# Patient Record
Sex: Female | Born: 1940 | Race: White | Hispanic: No | Marital: Married | Smoking: Never smoker
Health system: Southern US, Community
[De-identification: ages and names within clinical notes are randomized; demographics above are authoritative.]

## PROBLEM LIST (undated history)

## (undated) DIAGNOSIS — I10 Essential (primary) hypertension: Secondary | ICD-10-CM

## (undated) DIAGNOSIS — K219 Gastro-esophageal reflux disease without esophagitis: Secondary | ICD-10-CM

## (undated) DIAGNOSIS — K469 Unspecified abdominal hernia without obstruction or gangrene: Secondary | ICD-10-CM

## (undated) DIAGNOSIS — H3530 Unspecified macular degeneration: Secondary | ICD-10-CM

## (undated) DIAGNOSIS — D649 Anemia, unspecified: Secondary | ICD-10-CM

## (undated) HISTORY — DX: Unspecified macular degeneration: H35.30

## (undated) HISTORY — DX: Gastro-esophageal reflux disease without esophagitis: K21.9

## (undated) HISTORY — PX: UPPER GASTROINTESTINAL ENDOSCOPY: SHX188

## (undated) HISTORY — DX: Unspecified abdominal hernia without obstruction or gangrene: K46.9

## (undated) HISTORY — DX: Essential (primary) hypertension: I10

## (undated) HISTORY — PX: CHOLECYSTECTOMY: SHX55

## (undated) HISTORY — PX: COLONOSCOPY: SHX174

## (undated) HISTORY — PX: ABDOMINAL HYSTERECTOMY: SHX81

## (undated) HISTORY — PX: ORIF ANKLE FRACTURE: SUR919

---

## 2011-12-16 ENCOUNTER — Encounter (INDEPENDENT_AMBULATORY_CARE_PROVIDER_SITE_OTHER): Payer: Self-pay | Admitting: Internal Medicine

## 2011-12-16 ENCOUNTER — Ambulatory Visit (INDEPENDENT_AMBULATORY_CARE_PROVIDER_SITE_OTHER): Payer: Medicare Other | Admitting: Internal Medicine

## 2011-12-16 VITALS — BP 140/90 | HR 80 | Temp 98.1°F | Resp 16 | Ht 63.0 in | Wt 197.1 lb

## 2011-12-16 DIAGNOSIS — K219 Gastro-esophageal reflux disease without esophagitis: Secondary | ICD-10-CM

## 2011-12-16 DIAGNOSIS — D509 Iron deficiency anemia, unspecified: Secondary | ICD-10-CM | POA: Insufficient documentation

## 2011-12-16 DIAGNOSIS — I10 Essential (primary) hypertension: Secondary | ICD-10-CM | POA: Insufficient documentation

## 2011-12-16 MED ORDER — FERROUS SULFATE 325 (65 FE) MG PO TABS: 325.0000 mg | ORAL_TABLET | Freq: Two times a day (BID) | ORAL | Status: DC

## 2011-12-16 NOTE — Patient Instructions (Signed)
Ferrous sulfate 325 mg by mouth after breakfast and evening meal. Hemoccult x1 if stool turns black again. Esophagogastroduodenoscopy to be scheduled.

## 2011-12-16 NOTE — H&P (Signed)
Dictated; Job [phone]

## 2011-12-17 ENCOUNTER — Other Ambulatory Visit (INDEPENDENT_AMBULATORY_CARE_PROVIDER_SITE_OTHER): Payer: Self-pay | Admitting: *Deleted

## 2011-12-17 DIAGNOSIS — K921 Melena: Secondary | ICD-10-CM

## 2011-12-17 DIAGNOSIS — D509 Iron deficiency anemia, unspecified: Secondary | ICD-10-CM

## 2011-12-17 NOTE — H&P (Signed)
Sara, [name]               ACCOUNT NO.:  [id number]  MEDICAL RECORD NO.:  [id number]  LOCATION:                                 FACILITY:  PHYSICIAN:  Lionel December, M.D.    DATE OF BIRTH:  [birth year]  DATE OF ADMISSION:  12/21/2011 DATE OF DISCHARGE:  LH                             HISTORY & PHYSICAL   PRESENTING COMPLAINT:  Melena and anemia.  Studies suggest iron deficiency anemia.  HISTORY OF PRESENT ILLNESS:  Sara [name] is 71 year old Caucasian female, who is well known to me from previous evaluation for chronic GERD, GI bleed, and iron deficiency anemia, who was in usual state of health until about 4 weeks ago when she started experiencing intermittent melena or tarry stools.  She did not experience any nausea, vomiting, abdominal pain.  However, she noted she was getting tired quickly and had no energy, also noted dyspnea on walking.  She was seen at [facility] by Ms. [facility], 2 days ago.  She had lab studies prior to that visit and was noted to be anemic.  She also had iron studies confirming iron deficiency.  These are reviewed under lab data.  Her stool however was guaiac negative.  The patient states that last time she had tarry stool was 10 days ago.  Now she is having formed stool daily.  She states her heartburn is well controlled.  Only time she has problem is if she eats late or if she eats chocolate or rich foods.  She denies dysphagia, but at times she feels food is sitting at her chest for a while.  Lately, she has also noted gurgling in her chest.  She has a good appetite.  She has lost few pounds voluntarily over the last 1 year.  She states since she has been taking laxative, she has not experienced symptoms of diverticulitis.  She has been using Advil 3 tablets at a time once or twice daily on as needed basis for the last few weeks because of left knee pain.  She stopped using Advil and she saw Ms. Skillman, and she was  given oxycodone, she could use for her knee pain.  She does not have craving for ice or any other foods.  CURRENT MEDICATIONS: 1. Diltiazem 120 mg p.o. daily. 2. Colon herbal cleanser 2 tablets p.o. at bedtime. 3. MVI with minerals one p.o. daily. 4. Omega-3 one g p.o. b.i.d. 5. Omeprazole 20 mg p.o. b.i.d. 6. Oxycodone 5 mg q.6 p.r.n. 7. Probiotic 1 p.o. daily p.r.n. 8. Ranitidine 150 mg p.o. at bedtime.  PAST MEDICAL HISTORY:  Medical problems include hypertension, chronic GERD.  She was found to have large hiatal hernia by the upper GI series 30 years ago.  Few years ago, she was evaluated for iron deficiency anemia and had EGD colonoscopy as well as given capsule study.  More recently, she had another EGD and she was bleeding from possibly an ulcer at level of hiatus and treated endoscopically.  History of multiple episodes of diverticulitis, but she has not had one in 4 or 5 years.  Cholecystectomy in 1975.  She had BSO with hysterectomy for fibroids and excessive  bleeding in 1990.  She has had benign lesion removed from her left breast.  She had surgery for right ankle fracture in 1997.  ALLERGIES:  NK.  FAMILY HISTORY:  Father died of triple rupture at age 68.  Mother had CAD and died of mesenteric thrombosis at age 35.  She had 3 brothers and they are all deceased one died of tumor on his spine at age 59 and other one died after getting shot accidently at age 73, and third brother died of pancreatic carcinoma at age 55.  SOCIAL HISTORY:  She is married.  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  She has never smoked cigarettes, and drinks alcohol very occasionally.  PHYSICAL EXAMINATION:  VITAL SIGNS:  She weighs 197 pounds, she is 63 inches tall, pulse 80 per minute and regular, blood pressure 140/90, respirations 16, and temp is 98.1. HEENT:  Conjunctivae is somewhat pale.   Sclera is nonicteric. Oropharyngeal mucosa is normal.  No neck masses or thyromegaly noted. CARDIAC:  With regular rhythm.  Normal S1 and S2.  No murmur or gallop noted. LUNGS:  Clear to auscultation. ABDOMEN:  Full.  Bowel sounds are normal.  On palpation, soft, and nontender with no organomegaly or masses. RECTAL:  Deferred. EXTREMITIES:  No peripheral edema, clubbing, or koilonychia noted.  LABORATORY DATA:  From August 12, 2011, WBC 7.0, H and H 11.6 and 35.4, MCV was 85, platelet count 302,000.  Lab data from December 09, 2011, WBC 6.8, H and H is 9.4 and 32.4, MCV 75, platelet count 314,000.  Comprehensive chemistry panel, electrolytes normal, glucose 107, BUN 15, creatinine 0.71, bilirubin 0.2, AP 92, AST 14, ALT 14, total protein 6.8, with albumin of 4.1, calcium 9.2.  TSH 1.910, serum iron 15, TIBC 515, saturation 3%, serum ferritin 4, B12 757, folate greater than 19.9.  ASSESSMENT:  Sara [name] is a 71 year old Caucasian female who has been experiencing intermittent melena over the last few weeks.  Last episode was 10 days ago.  She also has experienced exertional dyspnea and fatigue and found to have microcytic anemia, studies confirming that she has iron deficiency.  She has history of such in the past.  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  She gives history of intermittent gurgling in her chest and I wonder if her hernia has gotten large and has a dependent segment with mucosal injury.  RECOMMENDATIONS: 1. Hemoccult x1, if she has another episode of tarry stool. 2. She will continue omeprazole may be at current dose. 3. Ferrous sulfate 325 mg p.o. b.i.d. 4. Diagnostic esophagogastroduodenoscopy to be scheduled in near     future.                                            ______________________________ Lionel December, M.D.     NR/MEDQ  D:  12/16/2011  T:  12/16/2011  Job:  [id number]  cc:   [facility] Royann Shivers, PA

## 2011-12-20 ENCOUNTER — Encounter (HOSPITAL_COMMUNITY): Payer: Self-pay | Admitting: Pharmacy Technician

## 2011-12-20 MED ORDER — SODIUM CHLORIDE 0.45 % IV SOLN
Freq: Once | INTRAVENOUS | Status: AC
  Administered 2011-12-21: 07:00:00 via INTRAVENOUS

## 2011-12-21 ENCOUNTER — Encounter (HOSPITAL_COMMUNITY): Payer: Self-pay

## 2011-12-21 ENCOUNTER — Telehealth (INDEPENDENT_AMBULATORY_CARE_PROVIDER_SITE_OTHER): Payer: Self-pay | Admitting: *Deleted

## 2011-12-21 ENCOUNTER — Ambulatory Visit (HOSPITAL_COMMUNITY)
Admission: RE | Admit: 2011-12-21 | Discharge: 2011-12-21 | Disposition: A | Discharge status: Home Health | Payer: Medicare Other | Source: Ambulatory Visit | Attending: Internal Medicine | Admitting: Internal Medicine

## 2011-12-21 ENCOUNTER — Encounter (HOSPITAL_COMMUNITY)
Admission: RE | Disposition: A | Discharge status: Home Health | Payer: Self-pay | Source: Ambulatory Visit | Attending: Internal Medicine

## 2011-12-21 DIAGNOSIS — I10 Essential (primary) hypertension: Secondary | ICD-10-CM | POA: Insufficient documentation

## 2011-12-21 DIAGNOSIS — K449 Diaphragmatic hernia without obstruction or gangrene: Secondary | ICD-10-CM

## 2011-12-21 DIAGNOSIS — D509 Iron deficiency anemia, unspecified: Secondary | ICD-10-CM

## 2011-12-21 DIAGNOSIS — Z8719 Personal history of other diseases of the digestive system: Secondary | ICD-10-CM

## 2011-12-21 DIAGNOSIS — K921 Melena: Secondary | ICD-10-CM | POA: Insufficient documentation

## 2011-12-21 DIAGNOSIS — Z79899 Other long term (current) drug therapy: Secondary | ICD-10-CM | POA: Insufficient documentation

## 2011-12-21 HISTORY — DX: Anemia, unspecified: D64.9

## 2011-12-21 HISTORY — PX: ESOPHAGOGASTRODUODENOSCOPY: SHX5428

## 2011-12-21 SURGERY — EGD (ESOPHAGOGASTRODUODENOSCOPY)
Anesthesia: Moderate Sedation

## 2011-12-21 MED ORDER — MIDAZOLAM HCL 5 MG/5ML IJ SOLN
INTRAMUSCULAR | Status: AC
  Filled 2011-12-21: qty 10

## 2011-12-21 MED ORDER — STERILE WATER FOR IRRIGATION IR SOLN
Status: DC | PRN
  Administered 2011-12-21: 08:00:00

## 2011-12-21 MED ORDER — MIDAZOLAM HCL 5 MG/5ML IJ SOLN
INTRAMUSCULAR | Status: DC | PRN
  Administered 2011-12-21 (×2): 2 mg via INTRAVENOUS

## 2011-12-21 MED ORDER — MEPERIDINE HCL 50 MG/ML IJ SOLN
INTRAMUSCULAR | Status: AC
  Filled 2011-12-21: qty 1

## 2011-12-21 MED ORDER — MEPERIDINE HCL 25 MG/ML IJ SOLN
INTRAMUSCULAR | Status: DC | PRN
  Administered 2011-12-21: 25 mg via INTRAVENOUS

## 2011-12-21 NOTE — Op Note (Signed)
EGD PROCEDURE REPORT  PATIENT:  Sara [name]  MR#:  [id number] Birthdate:  03/28/1941, 71 y.o., female Endoscopist:  Dr. Malissa Hippo, MD Referred By:  Ms. Roma Kayser, [facility]  Procedure Date: 12/21/2011  Procedure:   EGD  Indications:  Patient is a 71 year old Caucasian female who presents with history of melena and a deficiency anemia. She has been on Advil until recently. She has known large hard hernia has been evaluated with EGD colonoscopy and given capsule study in 2009 10 EGD 2000 and gastric Dieulafoy lesion. She is undergoing diagnostic EGD.            Informed Consent:  The risks, benefits, alternatives & imponderables which include, but are not limited to, bleeding, infection, perforation, drug reaction and potential missed lesion have been reviewed.  The potential for biopsy, lesion removal, esophageal dilation, etc. have also been discussed.  Questions have been answered.  All parties agreeable.  Please see history & physical in medical record for more information.  Medications:  Demerol 25 mg IV Versed 4 mg IV Cetacaine spray topically for oropharyngeal anesthesia  Description of procedure:  The endoscope was introduced through the mouth and advanced to the second portion of the duodenum without difficulty or limitations. The mucosal surfaces were surveyed very carefully during advancement of the scope and upon withdrawal.  Findings:  Esophagus:  Mucosa of the esophagus was normal. Unremarkable GE junction. Large hiatal hernia with a wide open hiatus.  Mucosa of the herniated part of the stomach was carefully examined and no lesions identified. Some bile noted in the dependent part of the hernia. GEJ:  31 cm Hiatus:  40 cm Stomach:  Part of the stomach that was below the diaphragm reveals normal mucosa. Pyloric channel was patent. Angularis was unremarkable. Fundus and cardia examined by retroflexion of scope and were normal. Duodenum:  Normal bulbar and post bulbar  mucosa.  Therapeutic/Diagnostic Maneuvers Performed:  None  Complications:  None  Impression: Large sliding hiatal hernia otherwise normal examination. It is possible that NSAID injury has healed now the she is off Advil.  Recommendations:  Continue anti-reflux therapy. H&H in 2 weeks. If she is documented to have recurrent melena or heme positive stools will consider further workup.   Reyah Streeter U  12/21/2011  7:50 AM  CC: Ms. Roma Kayser, PA, PA & Dr. No ref. provider found

## 2011-12-21 NOTE — H&P (Signed)
This is an update to history and physical from 12/16/2011. Has been no interval change in patient's condition. She is undergoing diagnostic EGD.

## 2011-12-21 NOTE — Telephone Encounter (Signed)
Jeani Hawking from ext. [phone] called and said Dr. Karilyn Cota wanted Sara [name] to have an H & H in 2 weeks.

## 2011-12-21 NOTE — Discharge Instructions (Signed)
Resume usual medications and diet. Hemoglobin and hematocrit to be checked in 2 weeks. Office will call. Notify if you have rectal bleeding or melena. No driving for 24 hours.  Esophagogastroduodenoscopy This is an endoscopic procedure (a procedure that uses a device like a flexible telescope) that allows your caregiver to view the upper stomach and small bowel. This test allows your caregiver to look at the esophagus. The esophagus carries food from your mouth to your stomach. They can also look at your duodenum. This is the first part of the small intestine that attaches to the stomach. This test is used to detect problems in the bowel such as ulcers and inflammation. PREPARATION FOR TEST Nothing to eat after midnight the day before the test. NORMAL FINDINGS Normal esophagus, stomach, and duodenum. Ranges for normal findings may vary among different laboratories and hospitals. You should always check with your doctor after having lab work or other tests done to discuss the meaning of your test results and whether your values are considered within normal limits. MEANING OF TEST  Your caregiver will go over the test results with you and discuss the importance and meaning of your results, as well as treatment options and the need for additional tests if necessary. OBTAINING THE TEST RESULTS It is your responsibility to obtain your test results. Ask the lab or department performing the test when and how you will get your results. Document Released: 01/21/2005 Document Revised: 09/09/2011 Document Reviewed: 08/30/2008 [facility] Patient Information 2012 [place], [place].

## 2011-12-22 ENCOUNTER — Telehealth (INDEPENDENT_AMBULATORY_CARE_PROVIDER_SITE_OTHER): Payer: Self-pay | Admitting: *Deleted

## 2011-12-22 DIAGNOSIS — D649 Anemia, unspecified: Secondary | ICD-10-CM

## 2011-12-22 NOTE — Telephone Encounter (Signed)
Lab noted for 01-04-12

## 2011-12-22 NOTE — Telephone Encounter (Signed)
Patient will need H/H 01-04-12.

## 2011-12-23 NOTE — Progress Notes (Signed)
PRESENTING COMPLAINT: Melena and anemia.  Studies suggest iron deficiency anemia.  HISTORY OF PRESENT ILLNESS: Sara [name] is 71 year old Caucasian female,  who is well known to me from previous evaluation for chronic GERD, GI  bleed, and iron deficiency anemia, who was in usual state of health  until about 4 weeks ago when she started experiencing intermittent  melena or tarry stools. She did not experience any nausea, vomiting,  abdominal pain. However, she noted she was getting tired quickly and  had no energy, also noted dyspnea on walking. She was seen at [facility] by Ms. [facility], 2 days ago. She had lab  studies prior to that visit and was noted to be anemic. She also had  iron studies confirming iron deficiency. These are reviewed under lab  data. Her stool however was guaiac negative. The patient states that  last time she had tarry stool was 10 days ago. Now she is having formed  stool daily. She states her heartburn is well controlled. Only time  she has problem is if she eats late or if she eats chocolate or rich  foods. She denies dysphagia, but at times she feels food is sitting at  her chest for a while. Lately, she has also noted gurgling in her  chest. She has a good appetite. She has lost few pounds voluntarily  over the last 1 year. She states since she has been taking laxative,  she has not experienced symptoms of diverticulitis. She has been using  Advil 3 tablets at a time once or twice daily on as needed basis for the  last few weeks because of left knee pain. She stopped using Advil and  she saw Ms. Skillman, and she was given oxycodone, she could use for her  knee pain. She does not have craving for ice or any other foods.  CURRENT MEDICATIONS:  1. Diltiazem 120 mg p.o. daily.  2. Colon herbal cleanser 2 tablets p.o. at bedtime.  3. MVI with minerals one p.o. daily.  4. Omega-3 one g p.o. b.i.d.  5. Omeprazole 20 mg p.o. b.i.d.  6.  Oxycodone 5 mg q.6 p.r.n.  7. Probiotic 1 p.o. daily p.r.n.  8. Ranitidine 150 mg p.o. at bedtime.  PAST MEDICAL HISTORY: Medical problems include hypertension, chronic  GERD. She was found to have large hiatal hernia by the upper GI series  30 years ago. Few years ago, she was evaluated for iron deficiency  anemia and had EGD colonoscopy as well as given capsule study. More  recently, she had another EGD and she was bleeding from possibly an  ulcer at level of hiatus and treated endoscopically.  History of multiple episodes of diverticulitis, but she has not had one  in 4 or 5 years.  Cholecystectomy in 1975.  She had BSO with hysterectomy for fibroids and excessive bleeding in  1990. She has had benign lesion removed from her left breast. She had  surgery for right ankle fracture in 1997.  ALLERGIES: NK.  FAMILY HISTORY: Father died of triple rupture at age 59. Mother had  CAD and died of mesenteric thrombosis at age 29. She had 3 brothers and  they are all deceased one died of tumor on his spine at age 53 and other  one died after getting shot accidently at age 80, and third brother died  of pancreatic carcinoma at age 44.  SOCIAL HISTORY: She is married. 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 She has never smoked cigarettes, and drinks alcohol  very occasionally.  PHYSICAL EXAMINATION: VITAL SIGNS: She weighs 197 pounds, she is 63  inches tall, pulse 80 per minute and regular, blood pressure 140/90,  respirations 16, and temp is 98.1.  HEENT: Conjunctivae is somewhat pale. Sclera is nonicteric.  Oropharyngeal mucosa is normal. No neck masses or thyromegaly noted.  CARDIAC: With regular rhythm. Normal S1 and S2. No murmur or gallop  noted.  LUNGS: Clear to auscultation.  ABDOMEN: Full. Bowel sounds are normal. On palpation, soft, and  nontender with no organomegaly or masses.  RECTAL:  Deferred.  EXTREMITIES: No peripheral edema, clubbing, or koilonychia noted.  LABORATORY DATA: From August 12, 2011, WBC 7.0, H and H 11.6 and 35.4,  MCV was 85, platelet count 302,000.  Lab data from December 09, 2011, WBC 6.8, H and H is 9.4 and 32.4, MCV 75,  platelet count 314,000. Comprehensive chemistry panel, electrolytes  normal, glucose 107, BUN 15, creatinine 0.71, bilirubin 0.2, AP 92, AST  14, ALT 14, total protein 6.8, with albumin of 4.1, calcium 9.2.  TSH 1.910, serum iron 15, TIBC 515, saturation 3%, serum ferritin 4, B12  757, folate greater than 19.9.  ASSESSMENT: Sara [name] is a 71 year old Caucasian female who has been  experiencing intermittent melena over the last few weeks. Last episode  was 10 days ago. She also has experienced exertional dyspnea and  fatigue and found to have microcytic anemia, studies confirming that she  has iron deficiency. She has history of such in the past. She has  known large hiatal hernia and she had a Dieulafoy lesion coagulated in  her stomach few years ago.  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 She gives history of intermittent  gurgling in her chest and I wonder if her hernia has gotten large and  has a dependent segment with mucosal injury.  RECOMMENDATIONS:  1. Hemoccult x1, if she has another episode of tarry stool.  2. She will continue omeprazole may be at current dose.  3. Ferrous sulfate 325 mg p.o. b.i.d.  4. Diagnostic esophagogastroduodenoscopy to be scheduled in near  future.

## 2011-12-24 ENCOUNTER — Encounter (HOSPITAL_COMMUNITY): Payer: Self-pay | Admitting: Internal Medicine

## 2011-12-24 ENCOUNTER — Telehealth (INDEPENDENT_AMBULATORY_CARE_PROVIDER_SITE_OTHER): Payer: Self-pay | Admitting: *Deleted

## 2011-12-24 ENCOUNTER — Encounter (INDEPENDENT_AMBULATORY_CARE_PROVIDER_SITE_OTHER): Payer: Self-pay | Admitting: *Deleted

## 2011-12-24 NOTE — Telephone Encounter (Signed)
Open in Error, however lab was noted.

## 2011-12-30 ENCOUNTER — Encounter (INDEPENDENT_AMBULATORY_CARE_PROVIDER_SITE_OTHER): Payer: Self-pay

## 2012-01-03 ENCOUNTER — Other Ambulatory Visit (INDEPENDENT_AMBULATORY_CARE_PROVIDER_SITE_OTHER): Payer: Self-pay | Admitting: Internal Medicine

## 2012-01-04 ENCOUNTER — Telehealth (INDEPENDENT_AMBULATORY_CARE_PROVIDER_SITE_OTHER): Payer: Self-pay | Admitting: *Deleted

## 2012-01-04 DIAGNOSIS — D649 Anemia, unspecified: Secondary | ICD-10-CM

## 2012-01-04 LAB — HEMOGLOBIN AND HEMATOCRIT, BLOOD: Hemoglobin: 10.5 g/dL — ABNORMAL LOW (ref 12.0–15.0)

## 2012-01-04 NOTE — Telephone Encounter (Signed)
Lab report to PCP  

## 2012-01-04 NOTE — Telephone Encounter (Signed)
Per Dr. Karilyn Cota the patient will need to have a repeat H/H in 2 months followed by a office appointment with him.

## 2012-01-05 NOTE — Telephone Encounter (Signed)
Apt has been scheduled for 03/21/12 at 10:30 am with Dr. Karilyn Cota

## 2012-03-03 ENCOUNTER — Other Ambulatory Visit (INDEPENDENT_AMBULATORY_CARE_PROVIDER_SITE_OTHER): Payer: Self-pay | Admitting: *Deleted

## 2012-03-03 ENCOUNTER — Encounter (INDEPENDENT_AMBULATORY_CARE_PROVIDER_SITE_OTHER): Payer: Self-pay | Admitting: *Deleted

## 2012-03-03 DIAGNOSIS — D649 Anemia, unspecified: Secondary | ICD-10-CM

## 2012-03-09 LAB — CBC WITH DIFFERENTIAL/PLATELET
Eosinophils Absolute: 0.2 10*3/uL (ref 0.0–0.7)
Hemoglobin: 13.8 g/dL (ref 12.0–15.0)
Lymphocytes Relative: 38 % (ref 12–46)
Lymphs Abs: 3 10*3/uL (ref 0.7–4.0)
MCH: 27.4 pg (ref 26.0–34.0)
Monocytes Relative: 9 % (ref 3–12)
Neutro Abs: 4.1 10*3/uL (ref 1.7–7.7)
Neutrophils Relative %: 50 % (ref 43–77)
Platelets: 242 10*3/uL (ref 150–400)
RBC: 5.04 MIL/uL (ref 3.87–5.11)
WBC: 8.1 10*3/uL (ref 4.0–10.5)

## 2012-03-21 ENCOUNTER — Ambulatory Visit (INDEPENDENT_AMBULATORY_CARE_PROVIDER_SITE_OTHER): Payer: Medicare Other | Admitting: Internal Medicine

## 2012-03-21 ENCOUNTER — Encounter (INDEPENDENT_AMBULATORY_CARE_PROVIDER_SITE_OTHER): Payer: Self-pay | Admitting: Internal Medicine

## 2012-03-21 VITALS — BP 122/74 | HR 72 | Temp 98.6°F | Resp 18 | Ht 63.0 in | Wt 197.2 lb

## 2012-03-21 DIAGNOSIS — D509 Iron deficiency anemia, unspecified: Secondary | ICD-10-CM

## 2012-03-21 DIAGNOSIS — K449 Diaphragmatic hernia without obstruction or gangrene: Secondary | ICD-10-CM

## 2012-03-21 DIAGNOSIS — K219 Gastro-esophageal reflux disease without esophagitis: Secondary | ICD-10-CM

## 2012-03-21 MED ORDER — FERROUS SULFATE 325 (65 FE) MG PO TABS: 325.0000 mg | ORAL_TABLET | Freq: Every day | ORAL | Status: DC

## 2012-03-21 NOTE — Patient Instructions (Signed)
Next hemoglobin and hematocrit 2 months. Physician will contact you with results ofUGIS.

## 2012-03-21 NOTE — Progress Notes (Signed)
Presenting complaint;  Follow for GERD and iron deficiency anemia.  Subjective:  Patient is 71 year old Caucasian female who was evaluated in March 2013 for iron deficiency anemia and heme-positive stool. EGD revealed large hiatal hernia. It was felt that she may have NSAID-induced injury to GI tract causing her iron deficiency anemia. Patient was begun on oral iron and maintained on PPI. She feels much better. She has heartburn no more than once a week which is transient. She denies dysphagia. She is still having gurgling in her chest almost daily but she denies pain or shortness of breath. She also denies nocturnal regurgitation. Her stools are hard dark because she takes iron..  Current Medications: Current Outpatient Prescriptions  Medication Sig Dispense Refill  . citalopram (CELEXA) 20 MG tablet 20 mg as needed.       . diltiazem (DILACOR XR) 120 MG 24 hr capsule Take 120 mg by mouth every evening.       . ferrous sulfate 325 (65 FE) MG tablet Take 1 tablet (325 mg total) by mouth 2 (two) times daily.  100 tablet  0  . HYDROcodone-acetaminophen (NORCO) 5-325 MG per tablet 1 tablet as needed.       . Misc Natural Products (COLON HERBAL CLEANSER PO) Take 2 capsules by mouth every evening. Acai Berry Cleanse- Patient takes 2 every night      . Multiple Vitamin (MULITIVITAMIN WITH MINERALS) TABS Take 1 tablet by mouth daily.      . Multiple Vitamins-Minerals (ICAPS) CAPS Take 1 capsule by mouth daily.       . Omega-3 Fatty Acids (FISH OIL) 1000 MG CAPS Take 2,000 mg by mouth every evening.       Marland Kitchen omeprazole (PRILOSEC) 20 MG capsule Take 40 mg by mouth daily.       . Probiotic Product (PROBIOTIC COLON SUPPORT) CAPS Take 1 tablet by mouth daily.       . ranitidine (ZANTAC) 150 MG capsule Take 150 mg by mouth every evening.          Objective: Blood pressure 122/74, pulse 72, temperature 98.6 F (37 C), temperature source Oral, resp. rate 18, height 5\' 3"  (1.6 m), weight 197 lb 3.2 oz  (89.449 kg). Patient is alert and in no acute distress. Conjunctiva is pink. Sclera is nonicteric Oropharyngeal mucosa is normal. No neck masses or thyromegaly noted. Cardiac exam with regular rhythm normal S1 and S2. No murmur or gallop noted. Lungs are clear to auscultation. Abdomen is full but soft and nontender without organomegaly or masses. No LE edema or clubbing noted.  Labs/studies Results: CBC from 03/08/2012 WBC 8.1 H&H 13.8 and 43.1, MCV 85.5 and platelet count 242K  Assessment:  #1. Iron deficiency anemia. H&H is back to normal. IDA felt to be secondary to NSAID induced GI bleed and impaired iron absorption with chronic acid suppression. #2. Large hiatal hernia. She remains with frequent gurgling in her chest but does not have any symptoms suggest refractory. I am concerned his large hernia may lead to problems down the road. Also needs to make sure she does not have organoaxial rotation.   Plan: Decrease ferrous sulfate to 325 milligrams by mouth daily H&H in 2 months. Upper GI series. Office visit in 6 months

## 2012-03-22 ENCOUNTER — Other Ambulatory Visit (INDEPENDENT_AMBULATORY_CARE_PROVIDER_SITE_OTHER): Payer: Self-pay | Admitting: Internal Medicine

## 2012-04-21 ENCOUNTER — Encounter (INDEPENDENT_AMBULATORY_CARE_PROVIDER_SITE_OTHER): Payer: Self-pay

## 2012-09-19 ENCOUNTER — Encounter (INDEPENDENT_AMBULATORY_CARE_PROVIDER_SITE_OTHER): Payer: Self-pay | Admitting: Internal Medicine

## 2012-09-19 ENCOUNTER — Ambulatory Visit (INDEPENDENT_AMBULATORY_CARE_PROVIDER_SITE_OTHER): Payer: Medicare Other | Admitting: Internal Medicine

## 2012-09-19 VITALS — BP 138/90 | HR 72 | Temp 98.1°F | Resp 18 | Ht 63.0 in | Wt 193.5 lb

## 2012-09-19 DIAGNOSIS — B0229 Other postherpetic nervous system involvement: Secondary | ICD-10-CM | POA: Insufficient documentation

## 2012-09-19 DIAGNOSIS — Z862 Personal history of diseases of the blood and blood-forming organs and certain disorders involving the immune mechanism: Secondary | ICD-10-CM

## 2012-09-19 DIAGNOSIS — K219 Gastro-esophageal reflux disease without esophagitis: Secondary | ICD-10-CM

## 2012-09-19 MED ORDER — FERROUS SULFATE 325 (65 FE) MG PO TABS: 325.0000 mg | ORAL_TABLET | Freq: Every day | ORAL | Status: DC

## 2012-09-19 NOTE — Patient Instructions (Signed)
Surgical consultation to be arranged in January 2014. Take one iron pill daily with food instead of two.

## 2012-09-19 NOTE — Progress Notes (Signed)
Presenting complaint;  Followup for chronic GERD and IDA.  Subjective:  Patient is 71 year old Caucasian female who presents for scheduled visit. She was last seen in June 2013. She has chronic GERD and known large hiatal hernia and history of iron deficiency anemia. She denies melena or rectal bleeding. She states her bowels move regularly as long as she stays on herbal laxative. She also reminds me that she has not experienced diverticulitis since she has been on laxative. She rarely experiences heartburn but she continues to have a gurgling in her chest every day. She is sleeping with her head end of bed elevated and does not eat in the evening otherwise she regurgitates. She is having occasional dysphagia like she had yesterday while she was eating hamburger without chewing thoroughly. 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  Current Medications: Current Outpatient Prescriptions  Medication Sig Dispense Refill  . citalopram (CELEXA) 20 MG tablet 20 mg as needed.       . diltiazem (DILACOR XR) 120 MG 24 hr capsule Take 120 mg by mouth every evening.       . ferrous sulfate 325 (65 FE) MG tablet TAKE (1) TABLET TWICE DAILY.  90 tablet  3  . HYDROcodone-acetaminophen (NORCO) 5-325 MG per tablet 1 tablet as needed.       Marland Kitchen ibuprofen (IBU-200) 200 MG tablet Take 200 mg by mouth as needed. Patient takes as needed,while she has cold      . Misc Natural Products (COLON HERBAL CLEANSER PO) Take 2 capsules by mouth every evening. Acai Berry Cleanse- Patient takes 2 every night      . Multiple Vitamin (MULITIVITAMIN WITH MINERALS) TABS Take 1 tablet by mouth daily.      . Multiple Vitamins-Minerals (ICAPS) CAPS Take 1 capsule by mouth daily.       . Omega-3 Fatty Acids (FISH OIL) 1000 MG CAPS Take 2,000 mg by mouth every evening.       Marland Kitchen omeprazole (PRILOSEC) 20 MG capsule Take 40 mg by mouth daily.       . Probiotic Product (PROBIOTIC COLON  SUPPORT) CAPS Take 1 tablet by mouth daily.       . Pseudoeph-Doxylamine-DM-APAP (NYQUIL) 60-7.03-02-999 MG/30ML LIQD Take by mouth. Patient states that she is taking 1 tablespoon at night while having cold         Objective: Blood pressure 138/90, pulse 72, temperature 98.1 F (36.7 C), temperature source Oral, resp. rate 18, height 5\' 3"  (1.6 m), weight 193 lb 8 oz (87.771 kg). Patient is alert and in no acute distress. Conjunctiva is pink. Sclera is nonicteric Oropharyngeal mucosa is normal. No neck masses or thyromegaly noted.. Abdomen is full but soft and nontender without organomegaly or masses. No LE edema or clubbing noted.  Labs/studies Results: From 09/07/2012. WBC 7.2, H&H 13.9 and 42, MCV 92 and platelet count 243K. Comprehensive chemistry panel within normal limits except glucose of 109.  Assessment:  #1. GERD. She has large sliding hiatal hernia with 50% of her stomach in her chest. Despite aggressive anti-reflex measures she still appears to be regurgitating. While her symptoms are manageable for now I'm afraid her symptoms will get worse with time and she should consider having this hernia fixed while she is in good health. Therefore will arrange for surgical consultation. #2. History of iron deficiency anemia secondary to GI blood loss from NSAID-induced injury and perhaps impaired iron absorption. Her H&H is normal.  She can decrease iron pill to one a day   Plan:  Will request surgical consultation with Dr. Claud Kelp for anti-reflux surgery. Patient will go to [facility] and pick up upper GI series for his review. Decrease ferrous sulfate 325 mg by mouth daily. Office visit in 6 months.

## 2012-11-02 ENCOUNTER — Ambulatory Visit (INDEPENDENT_AMBULATORY_CARE_PROVIDER_SITE_OTHER): Payer: Self-pay | Admitting: General Surgery

## 2012-11-07 ENCOUNTER — Encounter (INDEPENDENT_AMBULATORY_CARE_PROVIDER_SITE_OTHER): Payer: Self-pay

## 2012-11-14 ENCOUNTER — Ambulatory Visit (INDEPENDENT_AMBULATORY_CARE_PROVIDER_SITE_OTHER): Payer: Self-pay | Admitting: General Surgery

## 2012-11-20 ENCOUNTER — Encounter (INDEPENDENT_AMBULATORY_CARE_PROVIDER_SITE_OTHER): Payer: Self-pay | Admitting: General Surgery

## 2012-11-20 ENCOUNTER — Ambulatory Visit (INDEPENDENT_AMBULATORY_CARE_PROVIDER_SITE_OTHER): Payer: Medicare Other | Admitting: General Surgery

## 2012-11-20 VITALS — BP 158/100 | HR 84 | Temp 98.0°F | Resp 20 | Ht 63.0 in | Wt 202.2 lb

## 2012-11-20 DIAGNOSIS — K449 Diaphragmatic hernia without obstruction or gangrene: Secondary | ICD-10-CM

## 2012-11-20 DIAGNOSIS — K219 Gastro-esophageal reflux disease without esophagitis: Secondary | ICD-10-CM

## 2012-11-20 NOTE — Progress Notes (Signed)
Patient ID: Sara [name], female   DOB: 1941-02-15, 72 y.o.   MRN: [id number]  No chief complaint on file.   HPI Sara [name] is a 72 y.o. female.  She is referred by Dr. Karilyn Cota in [place] for evaluation of a large sliding hiatal hernia. Primary care is with Roma Kayser, PA.  She thinks she has been told she had a small hiatal hernia for many years. She's had reflux symptoms for over 10 years. Last year in March she was found to have iron deficiency anemia. Upper endoscopy showed a large sliding hiatal hernia, the esophagus was normal, the GE junction was normal, no lesions were identified in the mucosa of the stomach. GEJ at 31 cm. Pylorus was patent.  She previously had some end-stage induced bleeding and says that was cauterized or clipped previously. That has not recurred. Her most recent hemoglobin on 09/08/2012 is 13.9.  For the past 6 months she's been having somewhat more obstructive symptoms. She says that she will feel the foods sticking in her chest. Denies pain but says it feels a little heavy until it passes on through. No vomiting or regurgitation. Over the past 6 months she's had 3 episodes where she ate a  steak and it hung up in her chest she had sharp pain until it passed on through.   She does not have waterbrash now  but she sits up in bed to sleep at night because she was told to. She used to have waterbrash many years ago but said she has been on proton pump inhibitors she doesn't have any more.  During the interview she stated More than once that she really didn't feel that bad and really did not want to have an operation. HPI  Past Medical History  Diagnosis Date  . Hypertension   . GERD (gastroesophageal reflux disease)   . Hernia of unspecified site of abdominal cavity without mention of obstruction or gangrene   . Macular degeneration   . Anemia     IDA    Past Surgical History  Procedure Laterality Date  . Upper gastrointestinal endoscopy    .  Colonoscopy    . Abdominal hysterectomy    . Cholecystectomy    . Orif ankle fracture      right ankle  . Esophagogastroduodenoscopy  12/21/2011    Procedure: ESOPHAGOGASTRODUODENOSCOPY (EGD);  Surgeon: Malissa Hippo, MD;  Location: [facility];  Service: Endoscopy;  Laterality: N/A;  730    Family History  Problem Relation Age of Onset  . Heart disease Mother   . Aneurysm Father   . Pancreatic cancer Brother   . Healthy Daughter   . Healthy Daughter   . Healthy Daughter   . Healthy Son   . Healthy Son   . Anesthesia problems Neg Hx   . Hypotension Neg Hx   . Malignant hyperthermia Neg Hx   . Pseudochol deficiency Neg Hx     Social History History  Substance Use Topics  . Smoking status: Never Smoker   . Smokeless tobacco: Never Used  . Alcohol Use: Yes     Comment: Very Rare    No Known Allergies  Current Outpatient Prescriptions  Medication Sig Dispense Refill  . diltiazem (DILACOR XR) 120 MG 24 hr capsule Take 120 mg by mouth every evening.       . ferrous sulfate 325 (65 FE) MG tablet Take 1 tablet (325 mg total) by mouth daily with breakfast.      .  HYDROcodone-acetaminophen (NORCO) 5-325 MG per tablet 1 tablet as needed.       Marland Kitchen ibuprofen (IBU-200) 200 MG tablet Take 200 mg by mouth as needed. Patient takes as needed,while she has cold      . Misc Natural Products (COLON HERBAL CLEANSER PO) Take 2 capsules by mouth every evening. Acai Berry Cleanse- Patient takes 2 every night      . Multiple Vitamin (MULITIVITAMIN WITH MINERALS) TABS Take 1 tablet by mouth daily.      . Multiple Vitamins-Minerals (ICAPS) CAPS Take 1 capsule by mouth daily.       . Omega-3 Fatty Acids (FISH OIL) 1000 MG CAPS Take 2,000 mg by mouth every evening.       Marland Kitchen omeprazole (PRILOSEC) 20 MG capsule Take 40 mg by mouth daily.       . Probiotic Product (PROBIOTIC COLON SUPPORT) CAPS Take 1 tablet by mouth daily.       . Pseudoeph-Doxylamine-DM-APAP (NYQUIL) 60-7.03-02-999 MG/30ML LIQD  Take by mouth. Patient states that she is taking 1 tablespoon at night while having cold       No current facility-administered medications for this visit.    Review of Systems Review of Systems  Constitutional: Negative for fever, chills and unexpected weight change.  HENT: Negative for hearing loss, congestion, sore throat, trouble swallowing and voice change.   Eyes: Negative for visual disturbance.  Respiratory: Positive for chest tightness and shortness of breath. Negative for cough and wheezing.   Cardiovascular: Negative for chest pain, palpitations and leg swelling.  Gastrointestinal: Negative for nausea, vomiting, abdominal pain, diarrhea, constipation, blood in stool, abdominal distention and anal bleeding.  Genitourinary: Negative for hematuria, vaginal bleeding and difficulty urinating.  Musculoskeletal: Negative for arthralgias.  Skin: Negative for rash and wound.  Neurological: Negative for seizures, syncope and headaches.  Hematological: Negative for adenopathy. Does not bruise/bleed easily.  Psychiatric/Behavioral: Negative for confusion.    Blood pressure 158/100, pulse 84, temperature 98 F (36.7 C), temperature source Temporal, resp. rate 20, height 5\' 3"  (1.6 m), weight 202 lb 3.2 oz (91.717 kg).  Physical Exam Physical Exam  Constitutional: She is oriented to person, place, and time. She appears well-developed and well-nourished. No distress.  BMI 35  HENT:  Head: Normocephalic and atraumatic.  Nose: Nose normal.  Mouth/Throat: No oropharyngeal exudate.  Eyes: Conjunctivae and EOM are normal. Pupils are equal, round, and reactive to light. Left eye exhibits no discharge. No scleral icterus.  Neck: Neck supple. No JVD present. No tracheal deviation present. No thyromegaly present.  Cardiovascular: Normal rate, regular rhythm, normal heart sounds and intact distal pulses.   No murmur heard. Pulmonary/Chest: Effort normal and breath sounds normal. No respiratory  distress. She has no wheezes. She has no rales. She exhibits no tenderness.  Abdominal: Soft. Bowel sounds are normal. She exhibits no distension and no mass. There is no tenderness. There is no rebound and no guarding.  Transverse scar in the epigastrium from previous cholecystectomy, no hernia. Pfannenstiel incision well healed. No tenderness. No mass.  Musculoskeletal: She exhibits no edema and no tenderness.  Lymphadenopathy:    She has no cervical adenopathy.  Neurological: She is alert and oriented to person, place, and time. She exhibits normal muscle tone. Coordination normal.  Skin: Skin is warm. No rash noted. She is not diaphoretic. No erythema. No pallor.  Psychiatric: She has a normal mood and affect. Her behavior is normal. Judgment and thought content normal.    Data Reviewed I reviewed  the upper endoscopy report and the upper GI report from 2013. I reviewed Dr. Patty Sermons office notes. I reviewed lab work from December 2013.  Assessment    Sliding hiatal hernia, large, 50% of stomach in chest. She has developed intermittent obstructive symptoms over the past year. Concerning concerned that she may be intermittently twisting and obstructing her stomach   GERD. Symptoms mostly controlled otherwise on proton pump inhibitors  Obesity  History open cholecystectomy 1975  History TAH, BSO  Remote history diverticulitis    Plan    I had a long talk with the patient. I advised her of her diagnosis and explained to her with diagrams. I told her that her obstructive symptoms will progress over time until she has an operation. I told her that there is some risk to this including obstruction and ischemia and perforation. I described laparoscopic reduction and repair of hiatal hernia and anti-reflux procedure. She she is aware of the risks of the surgery including bleeding, infection, perforation, conversion to open laparotomy, recurrence of the hernia and so on.  She is now more  interested in contemplating operation, but does not want to have this done at this time. She wants to go home and think about this.  I do not think that manometry and pH testing it would be helpful here. The workup appears to be essentially complete.  She will return to see me in 3 months to see her she is doing. She will call me sooner if symptoms persist or progress. I think she is now aware of the risks and understands whatis  involved in a operation.        Angelia Mould. Derrell Lolling, M.D., [facility] General and Minimally invasive Surgery Breast and Colorectal Surgery Office:   [phone] Pager:   [phone]  11/20/2012, 2:55 PM

## 2012-11-20 NOTE — Patient Instructions (Signed)
You have a large sliding hiatal hernia.  You are beginning to develop obstructive symptoms.  This will not improve until you have an operation to correct the problem.  It is appropriate to have the operation to repair this at any time, as we discussed.  You have decided to go home and think about this and let me know when you are ready to have surgery.  In any event, you have been given an appointment to see me in 3 months to see how you're doing.        Hiatal Hernia A hiatal hernia occurs when a part of the stomach slides above the diaphragm. The diaphragm is the thin muscle separating the belly (abdomen) from the chest. A hiatal hernia can be something you are born with or develop over time. Hiatal hernias may allow stomach acid to flow back into your esophagus, the tube which carries food from your mouth to your stomach. If this acid causes problems it is called GERD (gastro-esophageal reflux disease).  SYMPTOMS  Common symptoms of GERD are heartburn (burning in your chest). This is worse when lying down or bending over. It may also cause belching and indigestion. Some of the things which make GERD worse are:  Increased weight pushes on stomach making acid rise more easily.  Smoking markedly increases acid production.  Alcohol decreases lower esophageal sphincter pressure (valve between stomach and esophagus), allowing acid from stomach into esophagus.  Late evening meals and going to bed with a full stomach increases pressure.  Anything that causes an increase in acid production.  Lower esophageal sphincter incompetence. DIAGNOSIS  Hiatal hernia is often diagnosed with x-rays of your stomach and small bowel. This is called an UGI (upper gastrointestinal x-ray). Sometimes a gastroscopic procedure is done. This is a procedure where your caregiver uses a flexible instrument to look into the stomach and small bowel. HOME CARE INSTRUCTIONS   Try to achieve and maintain an ideal  body weight.  Avoid drinking alcoholic beverages.  Stop smoking.  Put the head of your bed on 4 to 6 inch blocks. This will keep your head and esophagus higher than your stomach. If you cannot use blocks, sleep with several pillows under your head and shoulders.  Over-the-counter medications will decrease acid production. Your caregiver can also prescribe medications for this. Take as directed.  1/2 to 1 teaspoon of an antacid taken every hour while awake, with meals and at bedtime, will neutralize acid.  Do not take aspirin, ibuprofen (Advil or Motrin), or other nonsteroidal anti-inflammatory drugs.  Do not wear tight clothing around your chest or stomach.  Eat smaller meals and eat more frequently. This keeps your stomach from getting too full. Eat slowly.  Do not lie down for 2 or 3 hours after eating. Do not eat or drink anything 1 to 2 hours before going to bed.  Avoid caffeine beverages (colas, coffee, cocoa, tea), fatty foods, citrus fruits and all other foods and drinks that contain acid and that seem to increase the problems.  Avoid bending over, especially after eating. Also avoid straining during bowel movements or when urinating or lifting things. Anything that increases the pressure in your belly increases the amount of acid that may be pushed up into your esophagus. SEEK IMMEDIATE MEDICAL CARE IF:  There is change in location (pain in arms, neck, jaw, teeth or back) of your pain, or the pain is getting worse.  You also experience nausea, vomiting, sweating (diaphoresis), or shortness of breath.  You develop continual vomiting, vomit blood or coffee ground material, have bright red blood in your stools, or have black tarry stools. Some of these symptoms could signal other problems such as heart disease. MAKE SURE YOU:   Understand these instructions.  Monitor your condition.  Contact your caregiver if you are not doing well or are getting worse. Document Released:  12/11/2003 Document Revised: 12/13/2011 Document Reviewed: 09/20/2005 [facility] Patient Information 2013 [place], [place].

## 2012-11-21 ENCOUNTER — Encounter (INDEPENDENT_AMBULATORY_CARE_PROVIDER_SITE_OTHER): Payer: Self-pay

## 2013-02-16 ENCOUNTER — Ambulatory Visit (INDEPENDENT_AMBULATORY_CARE_PROVIDER_SITE_OTHER): Payer: Medicare Other | Admitting: General Surgery

## 2013-03-01 ENCOUNTER — Ambulatory Visit (INDEPENDENT_AMBULATORY_CARE_PROVIDER_SITE_OTHER): Payer: Medicare Other | Admitting: General Surgery

## 2013-03-07 ENCOUNTER — Encounter (INDEPENDENT_AMBULATORY_CARE_PROVIDER_SITE_OTHER): Payer: Self-pay | Admitting: General Surgery

## 2013-03-20 ENCOUNTER — Ambulatory Visit (INDEPENDENT_AMBULATORY_CARE_PROVIDER_SITE_OTHER): Payer: Medicare Other | Admitting: Internal Medicine

## 2013-04-11 ENCOUNTER — Ambulatory Visit (INDEPENDENT_AMBULATORY_CARE_PROVIDER_SITE_OTHER): Payer: Medicare Other | Admitting: Internal Medicine

## 2013-09-07 ENCOUNTER — Other Ambulatory Visit (INDEPENDENT_AMBULATORY_CARE_PROVIDER_SITE_OTHER): Payer: Self-pay | Admitting: Internal Medicine

## 2013-09-07 LAB — CBC WITH DIFFERENTIAL/PLATELET
Basophils Absolute: 0.1 10*3/uL (ref 0.0–0.1)
Eosinophils Relative: 2 % (ref 0–5)
HCT: 43.1 % (ref 36.0–46.0)
Hemoglobin: 15.3 g/dL — ABNORMAL HIGH (ref 12.0–15.0)
Lymphocytes Relative: 38 % (ref 12–46)
Lymphs Abs: 4.2 10*3/uL — ABNORMAL HIGH (ref 0.7–4.0)
MCV: 84.5 fL (ref 78.0–100.0)
Monocytes Absolute: 0.9 10*3/uL (ref 0.1–1.0)
Monocytes Relative: 8 % (ref 3–12)
Neutro Abs: 5.7 10*3/uL (ref 1.7–7.7)
RBC: 5.1 MIL/uL (ref 3.87–5.11)
RDW: 13.7 % (ref 11.5–15.5)
WBC: 11 10*3/uL — ABNORMAL HIGH (ref 4.0–10.5)

## 2013-09-08 LAB — COMPREHENSIVE METABOLIC PANEL
AST: 19 U/L (ref 0–37)
Albumin: 4.2 g/dL (ref 3.5–5.2)
BUN: 10 mg/dL (ref 6–23)
CO2: 35 mEq/L — ABNORMAL HIGH (ref 19–32)
Calcium: 9.8 mg/dL (ref 8.4–10.5)
Chloride: 91 mEq/L — ABNORMAL LOW (ref 96–112)
Creat: 0.7 mg/dL (ref 0.50–1.10)
Potassium: 3.8 mEq/L (ref 3.5–5.3)

## 2013-09-08 LAB — TSH: TSH: 0.488 u[IU]/mL (ref 0.350–4.500)

## 2014-07-04 ENCOUNTER — Other Ambulatory Visit (INDEPENDENT_AMBULATORY_CARE_PROVIDER_SITE_OTHER): Payer: Self-pay | Admitting: Internal Medicine

## 2014-12-21 ENCOUNTER — Other Ambulatory Visit (INDEPENDENT_AMBULATORY_CARE_PROVIDER_SITE_OTHER): Payer: Self-pay | Admitting: Internal Medicine

## 2014-12-25 NOTE — Telephone Encounter (Signed)
Patient will need to have a office visit prior to further refills.

## 2015-01-17 NOTE — Telephone Encounter (Signed)
LM on cell phone for patient to return the call.

## 2015-01-31 NOTE — Telephone Encounter (Signed)
LM for patient to return the call.  

## 2015-02-06 NOTE — Telephone Encounter (Signed)
LM for patient to return the call on cell number.

## 2015-02-07 ENCOUNTER — Emergency Department (HOSPITAL_COMMUNITY): Payer: Medicare Other

## 2015-02-07 ENCOUNTER — Emergency Department (HOSPITAL_COMMUNITY)
Admission: EM | Admit: 2015-02-07 | Discharge: 2015-02-07 | Disposition: A | Discharge status: Home / Self Care | Payer: Medicare Other | Attending: Emergency Medicine | Admitting: Emergency Medicine

## 2015-02-07 ENCOUNTER — Encounter (HOSPITAL_COMMUNITY): Payer: Self-pay | Admitting: *Deleted

## 2015-02-07 DIAGNOSIS — I10 Essential (primary) hypertension: Secondary | ICD-10-CM | POA: Insufficient documentation

## 2015-02-07 DIAGNOSIS — Z79899 Other long term (current) drug therapy: Secondary | ICD-10-CM | POA: Insufficient documentation

## 2015-02-07 DIAGNOSIS — R531 Weakness: Secondary | ICD-10-CM | POA: Diagnosis present

## 2015-02-07 DIAGNOSIS — D649 Anemia, unspecified: Secondary | ICD-10-CM | POA: Diagnosis not present

## 2015-02-07 DIAGNOSIS — R0602 Shortness of breath: Secondary | ICD-10-CM | POA: Insufficient documentation

## 2015-02-07 DIAGNOSIS — Z8669 Personal history of other diseases of the nervous system and sense organs: Secondary | ICD-10-CM | POA: Insufficient documentation

## 2015-02-07 DIAGNOSIS — R55 Syncope and collapse: Secondary | ICD-10-CM | POA: Insufficient documentation

## 2015-02-07 DIAGNOSIS — R42 Dizziness and giddiness: Secondary | ICD-10-CM | POA: Insufficient documentation

## 2015-02-07 DIAGNOSIS — K219 Gastro-esophageal reflux disease without esophagitis: Secondary | ICD-10-CM | POA: Insufficient documentation

## 2015-02-07 DIAGNOSIS — E876 Hypokalemia: Secondary | ICD-10-CM | POA: Insufficient documentation

## 2015-02-07 DIAGNOSIS — R5383 Other fatigue: Secondary | ICD-10-CM | POA: Diagnosis not present

## 2015-02-07 LAB — CBC WITH DIFFERENTIAL/PLATELET
BASOS ABS: 0.1 10*3/uL (ref 0.0–0.1)
BASOS PCT: 0 % (ref 0–1)
EOS PCT: 2 % (ref 0–5)
Eosinophils Absolute: 0.3 10*3/uL (ref 0.0–0.7)
HCT: 43.4 % (ref 36.0–46.0)
Hemoglobin: 14.9 g/dL (ref 12.0–15.0)
LYMPHS ABS: 5.3 10*3/uL — AB (ref 0.7–4.0)
LYMPHS PCT: 44 % (ref 12–46)
MCH: 29.6 pg (ref 26.0–34.0)
MCHC: 34.3 g/dL (ref 30.0–36.0)
MCV: 86.3 fL (ref 78.0–100.0)
Monocytes Absolute: 0.9 10*3/uL (ref 0.1–1.0)
Monocytes Relative: 8 % (ref 3–12)
NEUTROS ABS: 5.4 10*3/uL (ref 1.7–7.7)
Neutrophils Relative %: 46 % (ref 43–77)
PLATELETS: 312 10*3/uL (ref 150–400)
RBC: 5.03 MIL/uL (ref 3.87–5.11)
RDW: 13.3 % (ref 11.5–15.5)
WBC: 11.9 10*3/uL — AB (ref 4.0–10.5)

## 2015-02-07 LAB — URINALYSIS, ROUTINE W REFLEX MICROSCOPIC
BILIRUBIN URINE: NEGATIVE
GLUCOSE, UA: NEGATIVE mg/dL
Hgb urine dipstick: NEGATIVE
KETONES UR: NEGATIVE mg/dL
NITRITE: NEGATIVE
PH: 6.5 (ref 5.0–8.0)
PROTEIN: NEGATIVE mg/dL
Specific Gravity, Urine: 1.01 (ref 1.005–1.030)
Urobilinogen, UA: 0.2 mg/dL (ref 0.0–1.0)

## 2015-02-07 LAB — URINE MICROSCOPIC-ADD ON

## 2015-02-07 LAB — COMPREHENSIVE METABOLIC PANEL
ALBUMIN: 4 g/dL (ref 3.5–5.0)
ALT: 22 U/L (ref 14–54)
AST: 31 U/L (ref 15–41)
Alkaline Phosphatase: 91 U/L (ref 38–126)
Anion gap: 13 (ref 5–15)
BUN: 9 mg/dL (ref 6–20)
CALCIUM: 9.4 mg/dL (ref 8.9–10.3)
CHLORIDE: 92 mmol/L — AB (ref 101–111)
CO2: 29 mmol/L (ref 22–32)
CREATININE: 0.79 mg/dL (ref 0.44–1.00)
GFR calc Af Amer: >60 mL/min (ref >60)
GFR calc non Af Amer: >60 mL/min (ref >60)
Glucose, Bld: 134 mg/dL — ABNORMAL HIGH (ref 70–99)
Potassium: 2.9 mmol/L — ABNORMAL LOW (ref 3.5–5.1)
Sodium: 134 mmol/L — ABNORMAL LOW (ref 135–145)
TOTAL PROTEIN: 7.6 g/dL (ref 6.5–8.1)
Total Bilirubin: 0.5 mg/dL (ref 0.3–1.2)

## 2015-02-07 LAB — LIPASE, BLOOD: LIPASE: 30 U/L (ref 22–51)

## 2015-02-07 MED ORDER — SODIUM CHLORIDE 0.9 % IV SOLN: INTRAVENOUS | Status: DC

## 2015-02-07 MED ORDER — POTASSIUM CHLORIDE ER 10 MEQ PO TBCR: 10.0000 meq | EXTENDED_RELEASE_TABLET | Freq: Two times a day (BID) | ORAL | Status: DC

## 2015-02-07 MED ORDER — POTASSIUM CHLORIDE CRYS ER 20 MEQ PO TBCR
40.0000 meq | EXTENDED_RELEASE_TABLET | Freq: Once | ORAL | Status: AC
  Administered 2015-02-07: 40 meq via ORAL
  Filled 2015-02-07: qty 2

## 2015-02-07 MED ORDER — SODIUM CHLORIDE 0.9 % IV BOLUS (SEPSIS)
1000.0000 mL | Freq: Once | INTRAVENOUS | Status: AC
  Administered 2015-02-07: 1000 mL via INTRAVENOUS

## 2015-02-07 NOTE — ED Provider Notes (Signed)
CSN: [id number]     Arrival date & time 02/07/15  1341 History   First MD Initiated Contact with Patient 02/07/15 1344     No chief complaint on file.    (Consider location/radiation/quality/duration/timing/severity/associated sxs/prior Treatment) The history is provided by the patient.   a 74 year old female followed by Dr. Vernice JeffersonSkillman, patient with a complaint of weakness generalized lightheadedness some dizziness but no room spinning intermittent shortness of breath that started yesterday. Last week patient had diarrhea illness but the diarrhea stopped over the weekend. Patient felt a little bit better on Monday and Tuesday but that the symptoms started today no chest pain no head pain room air sats are 99%. Patient feels like you pass out but has not passed out.  Past Medical History  Diagnosis Date  . Hypertension   . GERD (gastroesophageal reflux disease)   . Hernia of unspecified site of abdominal cavity without mention of obstruction or gangrene   . Macular degeneration   . Anemia     IDA   Past Surgical History  Procedure Laterality Date  . Upper gastrointestinal endoscopy    . Colonoscopy    . Abdominal hysterectomy    . Cholecystectomy    . Orif ankle fracture      right ankle  . Esophagogastroduodenoscopy  12/21/2011    Procedure: ESOPHAGOGASTRODUODENOSCOPY (EGD);  Surgeon: Malissa HippoNajeeb U Rehman, MD;  Location: [facility];  Service: Endoscopy;  Laterality: N/A;  730   Family History  Problem Relation Age of Onset  . Heart disease Mother   . Aneurysm Father   . Pancreatic cancer Brother   . Healthy Daughter   . Healthy Daughter   . Healthy Daughter   . Healthy Son   . Healthy Son   . Anesthesia problems Neg Hx   . Hypotension Neg Hx   . Malignant hyperthermia Neg Hx   . Pseudochol deficiency Neg Hx    History  Substance Use Topics  . Smoking status: Never Smoker   . Smokeless tobacco: Never Used  . Alcohol Use: Yes     Comment: Very Rare   OB History    No  data available     Review of Systems  Constitutional: Positive for fatigue. Negative for fever.  HENT: Negative for congestion.   Eyes: Negative for visual disturbance.  Respiratory: Positive for shortness of breath.   Cardiovascular: Negative for chest pain.  Gastrointestinal: Negative for nausea, vomiting and abdominal pain.  Genitourinary: Negative for dysuria.  Musculoskeletal: Negative for back pain.  Skin: Negative for rash.  Neurological: Positive for light-headedness. Negative for dizziness and syncope.  Psychiatric/Behavioral: Negative for confusion.      Allergies  Review of patient's allergies indicates no known allergies.  Home Medications   Prior to Admission medications   Medication Sig Start Date End Date Taking? Authorizing Provider  diltiazem (DILACOR XR) 120 MG 24 hr capsule Take 120 mg by mouth every evening.    Yes Historical Provider, MD  hydrochlorothiazide (HYDRODIURIL) 25 MG tablet Take 1 tablet by mouth daily. 01/21/15  Yes Historical Provider, MD  HYDROcodone-acetaminophen (NORCO) 5-325 MG per tablet 1 tablet as needed.  02/21/12  Yes Historical Provider, MD  ibuprofen (IBU-200) 200 MG tablet Take 200 mg by mouth as needed. Patient takes as needed,while she has cold   Yes Historical Provider, MD  Misc Natural Products (COLON HERBAL CLEANSER PO) Take 2 capsules by mouth every evening. Acai Berry Cleanse- Patient takes 2 every night   Yes Historical Provider,  MD  Multiple Vitamin (MULITIVITAMIN WITH MINERALS) TABS Take 1 tablet by mouth daily.   Yes Historical Provider, MD  Multiple Vitamins-Minerals (ICAPS) CAPS Take 1 capsule by mouth daily.    Yes Historical Provider, MD  pantoprazole (PROTONIX) 40 MG tablet TAKE (1) TABLET TWICE DAILY. 12/25/14  Yes Malissa Hippo, MD  ferrous sulfate 325 (65 FE) MG tablet Take 1 tablet (325 mg total) by mouth daily with breakfast. Patient not taking: Reported on 02/07/2015 09/19/12   Malissa Hippo, MD   BP 140/78 mmHg   Pulse 92  Temp(Src) 98.7 F (37.1 C) (Oral)  Resp 15  Ht  (1.575 m)  Wt 188 lb (85.276 kg)  BMI 34.38 kg/m2  SpO2 93% Physical Exam  Constitutional: She is oriented to person, place, and time. She appears well-developed and well-nourished. No distress.  HENT:  Head: Normocephalic and atraumatic.  Mouth/Throat: Oropharynx is clear and moist.  Eyes: Conjunctivae and EOM are normal. Pupils are equal, round, and reactive to light.  Neck: Normal range of motion.  Cardiovascular: Normal rate, regular rhythm and normal heart sounds.   No murmur heard. Pulmonary/Chest: Effort normal and breath sounds normal. No respiratory distress.  Abdominal: Soft. Bowel sounds are normal. There is no tenderness.  Musculoskeletal: Normal range of motion.  Neurological: She is alert and oriented to person, place, and time. No cranial nerve deficit. She exhibits normal muscle tone. Coordination normal.  Skin: Skin is warm. No rash noted.  Nursing note and vitals reviewed.   ED Course  Procedures (including critical care time) Labs Review Labs Reviewed  COMPREHENSIVE METABOLIC PANEL - Abnormal; Notable for the following:    Sodium 134 (*)    Potassium 2.9 (*)    Chloride 92 (*)    Glucose, Bld 134 (*)    All other components within normal limits  URINALYSIS, ROUTINE W REFLEX MICROSCOPIC - Abnormal; Notable for the following:    APPearance HAZY (*)    Leukocytes, UA MODERATE (*)    All other components within normal limits  CBC WITH DIFFERENTIAL/PLATELET - Abnormal; Notable for the following:    WBC 11.9 (*)    Lymphs Abs 5.3 (*)    All other components within normal limits  URINE MICROSCOPIC-ADD ON - Abnormal; Notable for the following:    Squamous Epithelial / LPF MANY (*)    Bacteria, UA FEW (*)    All other components within normal limits  LIPASE, BLOOD   Results for orders placed or performed during the hospital encounter of 02/07/15  Lipase, blood  Result Value Ref Range   Lipase  30 22 - 51 U/L  Comprehensive metabolic panel  Result Value Ref Range   Sodium 134 (L) 135 - 145 mmol/L   Potassium 2.9 (L) 3.5 - 5.1 mmol/L   Chloride 92 (L) 101 - 111 mmol/L   CO2 29 22 - 32 mmol/L   Glucose, Bld 134 (H) 70 - 99 mg/dL   BUN 9 6 - 20 mg/dL   Creatinine, Ser 1.61 0.44 - 1.00 mg/dL   Calcium 9.4 8.9 - 09.6 mg/dL   Total Protein 7.6 6.5 - 8.1 g/dL   Albumin 4.0 3.5 - 5.0 g/dL   AST 31 15 - 41 U/L   ALT 22 14 - 54 U/L   Alkaline Phosphatase 91 38 - 126 U/L   Total Bilirubin 0.5 0.3 - 1.2 mg/dL   GFR calc non Af Amer >60 >60 mL/min   GFR calc Af Amer >60 >60  mL/min   Anion gap 13 5 - 15  Urinalysis, Routine w reflex microscopic  Result Value Ref Range   Color, Urine YELLOW YELLOW   APPearance HAZY (A) CLEAR   Specific Gravity, Urine 1.010 1.005 - 1.030   pH 6.5 5.0 - 8.0   Glucose, UA NEGATIVE NEGATIVE mg/dL   Hgb urine dipstick NEGATIVE NEGATIVE   Bilirubin Urine NEGATIVE NEGATIVE   Ketones, ur NEGATIVE NEGATIVE mg/dL   Protein, ur NEGATIVE NEGATIVE mg/dL   Urobilinogen, UA 0.2 0.0 - 1.0 mg/dL   Nitrite NEGATIVE NEGATIVE   Leukocytes, UA MODERATE (A) NEGATIVE  CBC with Differential/Platelet  Result Value Ref Range   WBC 11.9 (H) 4.0 - 10.5 K/uL   RBC 5.03 3.87 - 5.11 MIL/uL   Hemoglobin 14.9 12.0 - 15.0 g/dL   HCT 16.1 09.6 - 04.5 %   MCV 86.3 78.0 - 100.0 fL   MCH 29.6 26.0 - 34.0 pg   MCHC 34.3 30.0 - 36.0 g/dL   RDW 40.9 81.1 - 91.4 %   Platelets 312 150 - 400 K/uL   Neutrophils Relative % 46 43 - 77 %   Neutro Abs 5.4 1.7 - 7.7 K/uL   Lymphocytes Relative 44 12 - 46 %   Lymphs Abs 5.3 (H) 0.7 - 4.0 K/uL   Monocytes Relative 8 3 - 12 %   Monocytes Absolute 0.9 0.1 - 1.0 K/uL   Eosinophils Relative 2 0 - 5 %   Eosinophils Absolute 0.3 0.0 - 0.7 K/uL   Basophils Relative 0 0 - 1 %   Basophils Absolute 0.1 0.0 - 0.1 K/uL  Urine microscopic-add on  Result Value Ref Range   Squamous Epithelial / LPF MANY (A) RARE   WBC, UA 3-6 <3 WBC/hpf    Bacteria, UA FEW (A) RARE     Imaging Review Dg Chest 2 View  02/07/2015   CLINICAL DATA:  Illness last week with diarrhea. Weakness and shortness of breath intermittently. Dizziness.  EXAM: CHEST  2 VIEW  COMPARISON:  None.  FINDINGS: Large hiatal hernia is present with fluid level overlying the cardiopericardial silhouette on the frontal view. There is no airspace disease. No pleural effusion. Monitoring leads project over the chest. Osteopenia. Dense LEFT first costochondral calcification. Cardiopericardial silhouette appears within normal limits for size on the frontal projection.  IMPRESSION: No acute cardiopulmonary disease.  Large hiatal hernia.   Electronically Signed   By: Andreas Newport M.D.   On: 02/07/2015 14:59     EKG Interpretation   Date/Time:  Friday Feb 07 2015 13:53:01 EDT Ventricular Rate:  107 PR Interval:  186 QRS Duration: 92 QT Interval:  356 QTC Calculation: 475 R Axis:   -26 Text Interpretation:  Sinus tachycardia Borderline left axis deviation Low  voltage, precordial leads Baseline wander in lead(s) II No previous ECGs  available Confirmed by Arrietty Dercole  MD, Jennaya Pogue [phone]) on 02/07/2015 2:04:08 PM      MDM   Final diagnoses:  Near syncope  Hypokalemia    Workup for the weakness and feeling like you pass out seems show no evidence of anemia. However there is evidence of hypokalemia this could explain symptoms. Will treat with oral potassium supplements. Also sodium and chloride was down a little bit to this could be related to the diarrhea illness could be related to the hydrochlorothiazide medication. Treated here with 1 L of normal saline. Make an appointment to follow-up with your regular doctor after the weekend for recheck of your blood chemistries.  Chest x-ray negative for pneumonia EKG without any acute changes. No evidence of urinary tract infection.    Vanetta MuldersScott Yostin Malacara, MD 02/07/15 [phone]

## 2015-02-07 NOTE — Discharge Instructions (Signed)
The plan to follow-up with your doctor next week to have your potassium and electrolytes rechecked. Take the potassium supplement as directed for the next 5 days. Continue to hydrate yourself well. In the meantime be careful K she still may have some lightheadedness. Return for any new or worse symptoms.

## 2015-02-07 NOTE — ED Notes (Signed)
Dizzy,  Weakness and sob intermittently , Sick last week with diarrhea,

## 2015-02-27 NOTE — Telephone Encounter (Signed)
Apt has been scheduled for 04/02/15 with Dorene Arerri Setzer, NP.

## 2015-04-02 ENCOUNTER — Ambulatory Visit (INDEPENDENT_AMBULATORY_CARE_PROVIDER_SITE_OTHER): Payer: Medicare Other | Admitting: Internal Medicine

## 2015-06-04 ENCOUNTER — Encounter (INDEPENDENT_AMBULATORY_CARE_PROVIDER_SITE_OTHER): Payer: Self-pay | Admitting: *Deleted

## 2015-06-24 ENCOUNTER — Other Ambulatory Visit (INDEPENDENT_AMBULATORY_CARE_PROVIDER_SITE_OTHER): Payer: Self-pay | Admitting: Internal Medicine

## 2015-07-01 ENCOUNTER — Ambulatory Visit (INDEPENDENT_AMBULATORY_CARE_PROVIDER_SITE_OTHER): Payer: Medicare Other | Admitting: Internal Medicine

## 2015-07-14 ENCOUNTER — Encounter (INDEPENDENT_AMBULATORY_CARE_PROVIDER_SITE_OTHER): Payer: Self-pay | Admitting: Internal Medicine

## 2015-07-14 ENCOUNTER — Ambulatory Visit (INDEPENDENT_AMBULATORY_CARE_PROVIDER_SITE_OTHER): Payer: Medicare Other | Admitting: Internal Medicine

## 2015-07-14 VITALS — BP 130/90 | HR 64 | Temp 98.3°F | Ht 62.0 in | Wt 194.2 lb

## 2015-07-14 DIAGNOSIS — K219 Gastro-esophageal reflux disease without esophagitis: Secondary | ICD-10-CM | POA: Diagnosis not present

## 2015-07-14 DIAGNOSIS — R110 Nausea: Secondary | ICD-10-CM | POA: Diagnosis not present

## 2015-07-14 DIAGNOSIS — D509 Iron deficiency anemia, unspecified: Secondary | ICD-10-CM

## 2015-07-14 LAB — CBC WITH DIFFERENTIAL/PLATELET
BASOS PCT: 1 % (ref 0–1)
Basophils Absolute: 0.1 10*3/uL (ref 0.0–0.1)
EOS ABS: 0.3 10*3/uL (ref 0.0–0.7)
Eosinophils Relative: 3 % (ref 0–5)
HCT: 38.3 % (ref 36.0–46.0)
Hemoglobin: 13.5 g/dL (ref 12.0–15.0)
Lymphocytes Relative: 31 % (ref 12–46)
Lymphs Abs: 3 10*3/uL (ref 0.7–4.0)
MCH: 29.8 pg (ref 26.0–34.0)
MCHC: 35.2 g/dL (ref 30.0–36.0)
MCV: 84.5 fL (ref 78.0–100.0)
MONO ABS: 1.1 10*3/uL — AB (ref 0.1–1.0)
MPV: 9.4 fL (ref 8.6–12.4)
Monocytes Relative: 11 % (ref 3–12)
NEUTROS PCT: 54 % (ref 43–77)
Neutro Abs: 5.2 10*3/uL (ref 1.7–7.7)
PLATELETS: 281 10*3/uL (ref 150–400)
RBC: 4.53 MIL/uL (ref 3.87–5.11)
RDW: 13.5 % (ref 11.5–15.5)
WBC: 9.7 10*3/uL (ref 4.0–10.5)

## 2015-07-14 MED ORDER — ONDANSETRON HCL 4 MG PO TABS: 4.0000 mg | ORAL_TABLET | Freq: Three times a day (TID) | ORAL | Status: DC | PRN

## 2015-07-14 NOTE — Patient Instructions (Signed)
Continue PPI. OV in 1 yr.

## 2015-07-14 NOTE — Progress Notes (Addendum)
   Subjective:    Patient ID: Sara [name], female    DOB: 04/15/1941, 74 y.o.   MRN: [id number]  HPI 74 yr old white female here today for f/u of her chronic GERD. She of large hiatal hernia and hx of iron deficiency.  She tells me she is doing good. She watches how she eats. She is avoiding spicy foods.  Her appetite is good. She avoids breads and heavy foods. She has not lost any weight.  If she goes out to eat, she tries to eat in small amts. She saw Dr. Mikey Bussing 2013 for anti-reflux but decided not to have the surgery. She usually has a BM daily. No melena or BRRB.   12/21/2011 EGD  Indications: Patient is a 74 year old Caucasian female who presents with history of melena and a deficiency anemia. She has been on Advil until recently. She has known large hard hernia has been evaluated with EGD colonoscopy and given capsule study in 2009 . Focal gastritis involving herniated part of the stomach as well as at the level of diaphragmatic hiatus with some scarring. Small lesion at gastric body, very suspicious for Dieulafoy. Ablated with Gold probe. Suspect she has been bleeding intermittently from her upper GI tract. She is undergoing diagnostic EGD.  Impression: Large sliding hiatal hernia otherwise normal examination. It is possible that NSAID injury has healed now the she is off Advil.   Review of Systems Past Medical History  Diagnosis Date  . Hypertension   . GERD (gastroesophageal reflux disease)   . Hernia of unspecified site of abdominal cavity without mention of obstruction or gangrene   . Macular degeneration   . Anemia     IDA    Past Surgical History  Procedure Laterality Date  . Upper gastrointestinal endoscopy    . Colonoscopy    . Abdominal hysterectomy    . Cholecystectomy    . Orif ankle fracture      right ankle  . Esophagogastroduodenoscopy  12/21/2011    Procedure:  ESOPHAGOGASTRODUODENOSCOPY (EGD);  Surgeon: Malissa Hippo, MD;  Location: [facility];  Service: Endoscopy;  Laterality: N/A;  730    No Known Allergies  Current Outpatient Prescriptions on File Prior to Visit  Medication Sig Dispense Refill  . diltiazem (DILACOR XR) 120 MG 24 hr capsule Take 120 mg by mouth every evening.     Marland Kitchen HYDROcodone-acetaminophen (NORCO) 5-325 MG per tablet 1 tablet as needed.     . pantoprazole (PROTONIX) 40 MG tablet TAKE (1) TABLET TWICE DAILY. 60 tablet 0  . potassium chloride (K-DUR) 10 MEQ tablet Take 1 tablet (10 mEq total) by mouth 2 (two) times daily. 10 tablet 0   No current facility-administered medications on file prior to visit.        Objective:   Physical Exam Blood pressure 130/90, pulse 64, temperature 98.3 F (36.8 C), height  (1.575 m), weight 194 lb 3.2 oz (88.089 kg).  Alert and oriented. Skin warm and dry. Oral mucosa is moist.   . Sclera anicteric, conjunctivae is pink. Thyroid not enlarged. No cervical lymphadenopathy. Lungs clear. Heart regular rate and rhythm.  Abdomen is soft. Bowel sounds are positive. No hepatomegaly. No abdominal masses felt. No tenderness.  No edema to lower extremities.       Assessment & Plan:  GERD. Controlled at this time. She is on Protonix which helps. Anemia: Will need a CBC today. No recent lab work in [place]. OV in 1 yr.

## 2015-07-25 ENCOUNTER — Encounter (INDEPENDENT_AMBULATORY_CARE_PROVIDER_SITE_OTHER): Payer: Self-pay

## 2015-09-22 ENCOUNTER — Other Ambulatory Visit (INDEPENDENT_AMBULATORY_CARE_PROVIDER_SITE_OTHER): Payer: Self-pay | Admitting: Internal Medicine

## 2016-02-25 ENCOUNTER — Telehealth (INDEPENDENT_AMBULATORY_CARE_PROVIDER_SITE_OTHER): Payer: Self-pay | Admitting: *Deleted

## 2016-02-25 ENCOUNTER — Encounter (INDEPENDENT_AMBULATORY_CARE_PROVIDER_SITE_OTHER): Payer: Self-pay | Admitting: *Deleted

## 2016-02-25 DIAGNOSIS — R1310 Dysphagia, unspecified: Secondary | ICD-10-CM

## 2016-02-25 NOTE — Telephone Encounter (Signed)
UGI/esophagram sch'd 02/26/16

## 2016-02-25 NOTE — Telephone Encounter (Addendum)
Dr.Rehman called. He would like for us to arrange a UGI /Barium Pill Espogram. DX- Dysphagia

## 2016-02-26 ENCOUNTER — Ambulatory Visit (HOSPITAL_COMMUNITY)
Admission: RE | Admit: 2016-02-26 | Discharge: 2016-02-26 | Disposition: A | Discharge status: Home / Self Care | Payer: Medicare Other | Source: Ambulatory Visit | Attending: Internal Medicine | Admitting: Internal Medicine

## 2016-02-26 DIAGNOSIS — R1310 Dysphagia, unspecified: Secondary | ICD-10-CM | POA: Diagnosis present

## 2016-04-14 ENCOUNTER — Encounter (INDEPENDENT_AMBULATORY_CARE_PROVIDER_SITE_OTHER): Payer: Self-pay | Admitting: Internal Medicine

## 2016-07-13 ENCOUNTER — Ambulatory Visit (INDEPENDENT_AMBULATORY_CARE_PROVIDER_SITE_OTHER): Payer: Medicare Other | Admitting: Internal Medicine

## 2016-10-19 DIAGNOSIS — K219 Gastro-esophageal reflux disease without esophagitis: Secondary | ICD-10-CM | POA: Diagnosis not present

## 2016-10-19 DIAGNOSIS — E782 Mixed hyperlipidemia: Secondary | ICD-10-CM | POA: Diagnosis not present

## 2016-10-19 DIAGNOSIS — E876 Hypokalemia: Secondary | ICD-10-CM | POA: Diagnosis not present

## 2016-10-19 DIAGNOSIS — I10 Essential (primary) hypertension: Secondary | ICD-10-CM | POA: Diagnosis not present

## 2016-10-28 DIAGNOSIS — F419 Anxiety disorder, unspecified: Secondary | ICD-10-CM | POA: Diagnosis not present

## 2016-10-28 DIAGNOSIS — I10 Essential (primary) hypertension: Secondary | ICD-10-CM | POA: Diagnosis not present

## 2016-10-28 DIAGNOSIS — Z6831 Body mass index (BMI) 31.0-31.9, adult: Secondary | ICD-10-CM | POA: Diagnosis not present

## 2016-10-28 DIAGNOSIS — K219 Gastro-esophageal reflux disease without esophagitis: Secondary | ICD-10-CM | POA: Diagnosis not present

## 2016-10-28 DIAGNOSIS — E782 Mixed hyperlipidemia: Secondary | ICD-10-CM | POA: Diagnosis not present

## 2016-10-28 DIAGNOSIS — M545 Low back pain: Secondary | ICD-10-CM | POA: Diagnosis not present

## 2017-02-15 DIAGNOSIS — K219 Gastro-esophageal reflux disease without esophagitis: Secondary | ICD-10-CM | POA: Diagnosis not present

## 2017-02-15 DIAGNOSIS — I10 Essential (primary) hypertension: Secondary | ICD-10-CM | POA: Diagnosis not present

## 2017-02-15 DIAGNOSIS — E876 Hypokalemia: Secondary | ICD-10-CM | POA: Diagnosis not present

## 2017-02-15 DIAGNOSIS — E782 Mixed hyperlipidemia: Secondary | ICD-10-CM | POA: Diagnosis not present

## 2017-02-17 DIAGNOSIS — Z6831 Body mass index (BMI) 31.0-31.9, adult: Secondary | ICD-10-CM | POA: Diagnosis not present

## 2017-02-17 DIAGNOSIS — Z0000 Encounter for general adult medical examination without abnormal findings: Secondary | ICD-10-CM | POA: Diagnosis not present

## 2017-02-17 DIAGNOSIS — M545 Low back pain: Secondary | ICD-10-CM | POA: Diagnosis not present

## 2017-02-17 DIAGNOSIS — E782 Mixed hyperlipidemia: Secondary | ICD-10-CM | POA: Diagnosis not present

## 2017-02-17 DIAGNOSIS — K219 Gastro-esophageal reflux disease without esophagitis: Secondary | ICD-10-CM | POA: Diagnosis not present

## 2017-02-17 DIAGNOSIS — I10 Essential (primary) hypertension: Secondary | ICD-10-CM | POA: Diagnosis not present

## 2017-02-17 DIAGNOSIS — F419 Anxiety disorder, unspecified: Secondary | ICD-10-CM | POA: Diagnosis not present

## 2017-06-03 DIAGNOSIS — E782 Mixed hyperlipidemia: Secondary | ICD-10-CM | POA: Diagnosis not present

## 2017-06-03 DIAGNOSIS — K219 Gastro-esophageal reflux disease without esophagitis: Secondary | ICD-10-CM | POA: Diagnosis not present

## 2017-06-03 DIAGNOSIS — E876 Hypokalemia: Secondary | ICD-10-CM | POA: Diagnosis not present

## 2017-06-03 DIAGNOSIS — D500 Iron deficiency anemia secondary to blood loss (chronic): Secondary | ICD-10-CM | POA: Diagnosis not present

## 2017-06-03 DIAGNOSIS — F419 Anxiety disorder, unspecified: Secondary | ICD-10-CM | POA: Diagnosis not present

## 2017-06-03 DIAGNOSIS — I10 Essential (primary) hypertension: Secondary | ICD-10-CM | POA: Diagnosis not present

## 2017-06-03 DIAGNOSIS — R739 Hyperglycemia, unspecified: Secondary | ICD-10-CM | POA: Diagnosis not present

## 2017-06-15 DIAGNOSIS — Z6832 Body mass index (BMI) 32.0-32.9, adult: Secondary | ICD-10-CM | POA: Diagnosis not present

## 2017-06-15 DIAGNOSIS — F419 Anxiety disorder, unspecified: Secondary | ICD-10-CM | POA: Diagnosis not present

## 2017-06-15 DIAGNOSIS — E782 Mixed hyperlipidemia: Secondary | ICD-10-CM | POA: Diagnosis not present

## 2017-06-15 DIAGNOSIS — K219 Gastro-esophageal reflux disease without esophagitis: Secondary | ICD-10-CM | POA: Diagnosis not present

## 2017-06-15 DIAGNOSIS — M545 Low back pain: Secondary | ICD-10-CM | POA: Diagnosis not present

## 2017-06-15 DIAGNOSIS — I10 Essential (primary) hypertension: Secondary | ICD-10-CM | POA: Diagnosis not present

## 2017-12-19 DIAGNOSIS — I10 Essential (primary) hypertension: Secondary | ICD-10-CM | POA: Diagnosis not present

## 2017-12-19 DIAGNOSIS — E876 Hypokalemia: Secondary | ICD-10-CM | POA: Diagnosis not present

## 2017-12-19 DIAGNOSIS — R739 Hyperglycemia, unspecified: Secondary | ICD-10-CM | POA: Diagnosis not present

## 2017-12-19 DIAGNOSIS — D500 Iron deficiency anemia secondary to blood loss (chronic): Secondary | ICD-10-CM | POA: Diagnosis not present

## 2017-12-19 DIAGNOSIS — K219 Gastro-esophageal reflux disease without esophagitis: Secondary | ICD-10-CM | POA: Diagnosis not present

## 2017-12-19 DIAGNOSIS — M545 Low back pain: Secondary | ICD-10-CM | POA: Diagnosis not present

## 2017-12-19 DIAGNOSIS — R5383 Other fatigue: Secondary | ICD-10-CM | POA: Diagnosis not present

## 2017-12-19 DIAGNOSIS — E782 Mixed hyperlipidemia: Secondary | ICD-10-CM | POA: Diagnosis not present

## 2017-12-23 DIAGNOSIS — K219 Gastro-esophageal reflux disease without esophagitis: Secondary | ICD-10-CM | POA: Diagnosis not present

## 2017-12-23 DIAGNOSIS — F419 Anxiety disorder, unspecified: Secondary | ICD-10-CM | POA: Diagnosis not present

## 2017-12-23 DIAGNOSIS — E782 Mixed hyperlipidemia: Secondary | ICD-10-CM | POA: Diagnosis not present

## 2017-12-23 DIAGNOSIS — Z0000 Encounter for general adult medical examination without abnormal findings: Secondary | ICD-10-CM | POA: Diagnosis not present

## 2017-12-23 DIAGNOSIS — I10 Essential (primary) hypertension: Secondary | ICD-10-CM | POA: Diagnosis not present

## 2017-12-23 DIAGNOSIS — M545 Low back pain: Secondary | ICD-10-CM | POA: Diagnosis not present

## 2017-12-23 DIAGNOSIS — Z6832 Body mass index (BMI) 32.0-32.9, adult: Secondary | ICD-10-CM | POA: Diagnosis not present

## 2018-01-12 DIAGNOSIS — Z681 Body mass index (BMI) 19 or less, adult: Secondary | ICD-10-CM | POA: Diagnosis not present

## 2018-01-12 DIAGNOSIS — J019 Acute sinusitis, unspecified: Secondary | ICD-10-CM | POA: Diagnosis not present

## 2018-01-12 DIAGNOSIS — J209 Acute bronchitis, unspecified: Secondary | ICD-10-CM | POA: Diagnosis not present

## 2018-02-01 DIAGNOSIS — J0100 Acute maxillary sinusitis, unspecified: Secondary | ICD-10-CM | POA: Diagnosis not present

## 2018-02-01 DIAGNOSIS — R05 Cough: Secondary | ICD-10-CM | POA: Diagnosis not present

## 2018-02-01 DIAGNOSIS — Z6832 Body mass index (BMI) 32.0-32.9, adult: Secondary | ICD-10-CM | POA: Diagnosis not present

## 2018-06-26 DIAGNOSIS — K219 Gastro-esophageal reflux disease without esophagitis: Secondary | ICD-10-CM | POA: Diagnosis not present

## 2018-06-26 DIAGNOSIS — E876 Hypokalemia: Secondary | ICD-10-CM | POA: Diagnosis not present

## 2018-06-26 DIAGNOSIS — D500 Iron deficiency anemia secondary to blood loss (chronic): Secondary | ICD-10-CM | POA: Diagnosis not present

## 2018-06-26 DIAGNOSIS — I10 Essential (primary) hypertension: Secondary | ICD-10-CM | POA: Diagnosis not present

## 2018-06-26 DIAGNOSIS — R739 Hyperglycemia, unspecified: Secondary | ICD-10-CM | POA: Diagnosis not present

## 2018-06-26 DIAGNOSIS — E782 Mixed hyperlipidemia: Secondary | ICD-10-CM | POA: Diagnosis not present

## 2018-07-12 DIAGNOSIS — Z1331 Encounter for screening for depression: Secondary | ICD-10-CM | POA: Diagnosis not present

## 2018-07-12 DIAGNOSIS — I10 Essential (primary) hypertension: Secondary | ICD-10-CM | POA: Diagnosis not present

## 2018-07-12 DIAGNOSIS — M545 Low back pain: Secondary | ICD-10-CM | POA: Diagnosis not present

## 2018-07-12 DIAGNOSIS — Z1389 Encounter for screening for other disorder: Secondary | ICD-10-CM | POA: Diagnosis not present

## 2018-07-12 DIAGNOSIS — F419 Anxiety disorder, unspecified: Secondary | ICD-10-CM | POA: Diagnosis not present

## 2018-07-12 DIAGNOSIS — G252 Other specified forms of tremor: Secondary | ICD-10-CM | POA: Diagnosis not present

## 2018-07-12 DIAGNOSIS — Z6832 Body mass index (BMI) 32.0-32.9, adult: Secondary | ICD-10-CM | POA: Diagnosis not present

## 2018-07-12 DIAGNOSIS — E782 Mixed hyperlipidemia: Secondary | ICD-10-CM | POA: Diagnosis not present

## 2019-01-02 DIAGNOSIS — M545 Low back pain: Secondary | ICD-10-CM | POA: Diagnosis not present

## 2019-01-02 DIAGNOSIS — E782 Mixed hyperlipidemia: Secondary | ICD-10-CM | POA: Diagnosis not present

## 2019-01-02 DIAGNOSIS — I10 Essential (primary) hypertension: Secondary | ICD-10-CM | POA: Diagnosis not present

## 2019-01-02 DIAGNOSIS — K219 Gastro-esophageal reflux disease without esophagitis: Secondary | ICD-10-CM | POA: Diagnosis not present

## 2019-01-02 DIAGNOSIS — F419 Anxiety disorder, unspecified: Secondary | ICD-10-CM | POA: Diagnosis not present

## 2019-01-08 DIAGNOSIS — E11319 Type 2 diabetes mellitus with unspecified diabetic retinopathy without macular edema: Secondary | ICD-10-CM | POA: Diagnosis not present

## 2019-01-08 DIAGNOSIS — N400 Enlarged prostate without lower urinary tract symptoms: Secondary | ICD-10-CM | POA: Diagnosis not present

## 2019-01-08 DIAGNOSIS — Z9181 History of falling: Secondary | ICD-10-CM | POA: Diagnosis not present

## 2019-01-08 DIAGNOSIS — L89620 Pressure ulcer of left heel, unstageable: Secondary | ICD-10-CM | POA: Diagnosis not present

## 2019-01-08 DIAGNOSIS — D500 Iron deficiency anemia secondary to blood loss (chronic): Secondary | ICD-10-CM | POA: Diagnosis not present

## 2019-01-08 DIAGNOSIS — R739 Hyperglycemia, unspecified: Secondary | ICD-10-CM | POA: Diagnosis not present

## 2019-01-08 DIAGNOSIS — I700 Atherosclerosis of aorta: Secondary | ICD-10-CM | POA: Diagnosis not present

## 2019-01-08 DIAGNOSIS — E782 Mixed hyperlipidemia: Secondary | ICD-10-CM | POA: Diagnosis not present

## 2019-01-08 DIAGNOSIS — E785 Hyperlipidemia, unspecified: Secondary | ICD-10-CM | POA: Diagnosis not present

## 2019-01-08 DIAGNOSIS — I10 Essential (primary) hypertension: Secondary | ICD-10-CM | POA: Diagnosis not present

## 2019-01-08 DIAGNOSIS — K529 Noninfective gastroenteritis and colitis, unspecified: Secondary | ICD-10-CM | POA: Diagnosis not present

## 2019-01-08 DIAGNOSIS — E669 Obesity, unspecified: Secondary | ICD-10-CM | POA: Diagnosis not present

## 2019-01-08 DIAGNOSIS — L03315 Cellulitis of perineum: Secondary | ICD-10-CM | POA: Diagnosis not present

## 2019-01-08 DIAGNOSIS — M179 Osteoarthritis of knee, unspecified: Secondary | ICD-10-CM | POA: Diagnosis not present

## 2019-01-08 DIAGNOSIS — E1129 Type 2 diabetes mellitus with other diabetic kidney complication: Secondary | ICD-10-CM | POA: Diagnosis not present

## 2019-01-08 DIAGNOSIS — R7309 Other abnormal glucose: Secondary | ICD-10-CM | POA: Diagnosis not present

## 2019-01-08 DIAGNOSIS — Z87891 Personal history of nicotine dependence: Secondary | ICD-10-CM | POA: Diagnosis not present

## 2019-01-08 DIAGNOSIS — L02215 Cutaneous abscess of perineum: Secondary | ICD-10-CM | POA: Diagnosis not present

## 2019-01-08 DIAGNOSIS — E876 Hypokalemia: Secondary | ICD-10-CM | POA: Diagnosis not present

## 2019-01-08 DIAGNOSIS — R5383 Other fatigue: Secondary | ICD-10-CM | POA: Diagnosis not present

## 2019-01-08 DIAGNOSIS — E1140 Type 2 diabetes mellitus with diabetic neuropathy, unspecified: Secondary | ICD-10-CM | POA: Diagnosis not present

## 2019-01-08 DIAGNOSIS — K219 Gastro-esophageal reflux disease without esophagitis: Secondary | ICD-10-CM | POA: Diagnosis not present

## 2019-01-08 DIAGNOSIS — Z794 Long term (current) use of insulin: Secondary | ICD-10-CM | POA: Diagnosis not present

## 2019-01-11 DIAGNOSIS — K219 Gastro-esophageal reflux disease without esophagitis: Secondary | ICD-10-CM | POA: Diagnosis not present

## 2019-01-11 DIAGNOSIS — M545 Low back pain: Secondary | ICD-10-CM | POA: Diagnosis not present

## 2019-01-11 DIAGNOSIS — Z6832 Body mass index (BMI) 32.0-32.9, adult: Secondary | ICD-10-CM | POA: Diagnosis not present

## 2019-01-11 DIAGNOSIS — Z1231 Encounter for screening mammogram for malignant neoplasm of breast: Secondary | ICD-10-CM | POA: Diagnosis not present

## 2019-01-11 DIAGNOSIS — F419 Anxiety disorder, unspecified: Secondary | ICD-10-CM | POA: Diagnosis not present

## 2019-01-11 DIAGNOSIS — E782 Mixed hyperlipidemia: Secondary | ICD-10-CM | POA: Diagnosis not present

## 2019-01-11 DIAGNOSIS — I10 Essential (primary) hypertension: Secondary | ICD-10-CM | POA: Diagnosis not present

## 2019-05-03 ENCOUNTER — Other Ambulatory Visit: Payer: Self-pay

## 2019-06-27 DIAGNOSIS — F419 Anxiety disorder, unspecified: Secondary | ICD-10-CM | POA: Diagnosis not present

## 2019-06-27 DIAGNOSIS — Z6834 Body mass index (BMI) 34.0-34.9, adult: Secondary | ICD-10-CM | POA: Diagnosis not present

## 2019-06-27 DIAGNOSIS — K219 Gastro-esophageal reflux disease without esophagitis: Secondary | ICD-10-CM | POA: Diagnosis not present

## 2019-06-27 DIAGNOSIS — M545 Low back pain: Secondary | ICD-10-CM | POA: Diagnosis not present

## 2019-06-27 DIAGNOSIS — E782 Mixed hyperlipidemia: Secondary | ICD-10-CM | POA: Diagnosis not present

## 2019-06-27 DIAGNOSIS — I10 Essential (primary) hypertension: Secondary | ICD-10-CM | POA: Diagnosis not present

## 2019-08-03 DIAGNOSIS — E782 Mixed hyperlipidemia: Secondary | ICD-10-CM | POA: Diagnosis not present

## 2019-08-03 DIAGNOSIS — I10 Essential (primary) hypertension: Secondary | ICD-10-CM | POA: Diagnosis not present

## 2019-10-04 DIAGNOSIS — E782 Mixed hyperlipidemia: Secondary | ICD-10-CM | POA: Diagnosis not present

## 2019-10-04 DIAGNOSIS — I10 Essential (primary) hypertension: Secondary | ICD-10-CM | POA: Diagnosis not present

## 2019-11-02 DIAGNOSIS — E7849 Other hyperlipidemia: Secondary | ICD-10-CM | POA: Diagnosis not present

## 2019-11-02 DIAGNOSIS — I10 Essential (primary) hypertension: Secondary | ICD-10-CM | POA: Diagnosis not present

## 2019-11-30 DIAGNOSIS — E7849 Other hyperlipidemia: Secondary | ICD-10-CM | POA: Diagnosis not present

## 2019-11-30 DIAGNOSIS — I10 Essential (primary) hypertension: Secondary | ICD-10-CM | POA: Diagnosis not present

## 2019-12-10 DIAGNOSIS — F419 Anxiety disorder, unspecified: Secondary | ICD-10-CM | POA: Diagnosis not present

## 2019-12-10 DIAGNOSIS — K219 Gastro-esophageal reflux disease without esophagitis: Secondary | ICD-10-CM | POA: Diagnosis not present

## 2019-12-10 DIAGNOSIS — M545 Low back pain: Secondary | ICD-10-CM | POA: Diagnosis not present

## 2019-12-10 DIAGNOSIS — I10 Essential (primary) hypertension: Secondary | ICD-10-CM | POA: Diagnosis not present

## 2019-12-10 DIAGNOSIS — E782 Mixed hyperlipidemia: Secondary | ICD-10-CM | POA: Diagnosis not present

## 2020-01-02 DIAGNOSIS — E7849 Other hyperlipidemia: Secondary | ICD-10-CM | POA: Diagnosis not present

## 2020-01-02 DIAGNOSIS — I10 Essential (primary) hypertension: Secondary | ICD-10-CM | POA: Diagnosis not present

## 2020-02-01 DIAGNOSIS — I10 Essential (primary) hypertension: Secondary | ICD-10-CM | POA: Diagnosis not present

## 2020-02-01 DIAGNOSIS — E7849 Other hyperlipidemia: Secondary | ICD-10-CM | POA: Diagnosis not present

## 2020-04-02 DIAGNOSIS — E7849 Other hyperlipidemia: Secondary | ICD-10-CM | POA: Diagnosis not present

## 2020-04-02 DIAGNOSIS — K219 Gastro-esophageal reflux disease without esophagitis: Secondary | ICD-10-CM | POA: Diagnosis not present

## 2020-04-02 DIAGNOSIS — D500 Iron deficiency anemia secondary to blood loss (chronic): Secondary | ICD-10-CM | POA: Diagnosis not present

## 2020-04-02 DIAGNOSIS — I10 Essential (primary) hypertension: Secondary | ICD-10-CM | POA: Diagnosis not present

## 2020-05-02 DIAGNOSIS — D500 Iron deficiency anemia secondary to blood loss (chronic): Secondary | ICD-10-CM | POA: Diagnosis not present

## 2020-05-02 DIAGNOSIS — E7849 Other hyperlipidemia: Secondary | ICD-10-CM | POA: Diagnosis not present

## 2020-05-02 DIAGNOSIS — K219 Gastro-esophageal reflux disease without esophagitis: Secondary | ICD-10-CM | POA: Diagnosis not present

## 2020-05-02 DIAGNOSIS — I10 Essential (primary) hypertension: Secondary | ICD-10-CM | POA: Diagnosis not present

## 2020-06-03 DIAGNOSIS — K219 Gastro-esophageal reflux disease without esophagitis: Secondary | ICD-10-CM | POA: Diagnosis not present

## 2020-06-03 DIAGNOSIS — D500 Iron deficiency anemia secondary to blood loss (chronic): Secondary | ICD-10-CM | POA: Diagnosis not present

## 2020-06-03 DIAGNOSIS — E7849 Other hyperlipidemia: Secondary | ICD-10-CM | POA: Diagnosis not present

## 2020-06-03 DIAGNOSIS — I10 Essential (primary) hypertension: Secondary | ICD-10-CM | POA: Diagnosis not present

## 2020-06-05 DIAGNOSIS — E782 Mixed hyperlipidemia: Secondary | ICD-10-CM | POA: Diagnosis not present

## 2020-06-05 DIAGNOSIS — I10 Essential (primary) hypertension: Secondary | ICD-10-CM | POA: Diagnosis not present

## 2020-06-05 DIAGNOSIS — R5383 Other fatigue: Secondary | ICD-10-CM | POA: Diagnosis not present

## 2020-06-05 DIAGNOSIS — Z13220 Encounter for screening for lipoid disorders: Secondary | ICD-10-CM | POA: Diagnosis not present

## 2020-06-05 DIAGNOSIS — D500 Iron deficiency anemia secondary to blood loss (chronic): Secondary | ICD-10-CM | POA: Diagnosis not present

## 2020-06-05 DIAGNOSIS — R739 Hyperglycemia, unspecified: Secondary | ICD-10-CM | POA: Diagnosis not present

## 2020-06-05 DIAGNOSIS — K219 Gastro-esophageal reflux disease without esophagitis: Secondary | ICD-10-CM | POA: Diagnosis not present

## 2020-06-10 DIAGNOSIS — M545 Low back pain: Secondary | ICD-10-CM | POA: Diagnosis not present

## 2020-06-10 DIAGNOSIS — F419 Anxiety disorder, unspecified: Secondary | ICD-10-CM | POA: Diagnosis not present

## 2020-06-10 DIAGNOSIS — K219 Gastro-esophageal reflux disease without esophagitis: Secondary | ICD-10-CM | POA: Diagnosis not present

## 2020-06-10 DIAGNOSIS — E782 Mixed hyperlipidemia: Secondary | ICD-10-CM | POA: Diagnosis not present

## 2020-06-10 DIAGNOSIS — Z6832 Body mass index (BMI) 32.0-32.9, adult: Secondary | ICD-10-CM | POA: Diagnosis not present

## 2020-06-10 DIAGNOSIS — I10 Essential (primary) hypertension: Secondary | ICD-10-CM | POA: Diagnosis not present

## 2020-08-02 DIAGNOSIS — E7849 Other hyperlipidemia: Secondary | ICD-10-CM | POA: Diagnosis not present

## 2020-08-02 DIAGNOSIS — K219 Gastro-esophageal reflux disease without esophagitis: Secondary | ICD-10-CM | POA: Diagnosis not present

## 2020-08-02 DIAGNOSIS — I10 Essential (primary) hypertension: Secondary | ICD-10-CM | POA: Diagnosis not present

## 2020-08-02 DIAGNOSIS — D500 Iron deficiency anemia secondary to blood loss (chronic): Secondary | ICD-10-CM | POA: Diagnosis not present

## 2020-09-02 DIAGNOSIS — D500 Iron deficiency anemia secondary to blood loss (chronic): Secondary | ICD-10-CM | POA: Diagnosis not present

## 2020-09-02 DIAGNOSIS — K219 Gastro-esophageal reflux disease without esophagitis: Secondary | ICD-10-CM | POA: Diagnosis not present

## 2020-09-02 DIAGNOSIS — E7849 Other hyperlipidemia: Secondary | ICD-10-CM | POA: Diagnosis not present

## 2020-09-02 DIAGNOSIS — I10 Essential (primary) hypertension: Secondary | ICD-10-CM | POA: Diagnosis not present

## 2020-10-03 DIAGNOSIS — I10 Essential (primary) hypertension: Secondary | ICD-10-CM | POA: Diagnosis not present

## 2020-10-03 DIAGNOSIS — D500 Iron deficiency anemia secondary to blood loss (chronic): Secondary | ICD-10-CM | POA: Diagnosis not present

## 2020-10-03 DIAGNOSIS — E7849 Other hyperlipidemia: Secondary | ICD-10-CM | POA: Diagnosis not present

## 2020-10-03 DIAGNOSIS — K219 Gastro-esophageal reflux disease without esophagitis: Secondary | ICD-10-CM | POA: Diagnosis not present

## 2020-12-01 DIAGNOSIS — I10 Essential (primary) hypertension: Secondary | ICD-10-CM | POA: Diagnosis not present

## 2020-12-01 DIAGNOSIS — K219 Gastro-esophageal reflux disease without esophagitis: Secondary | ICD-10-CM | POA: Diagnosis not present

## 2020-12-01 DIAGNOSIS — E7849 Other hyperlipidemia: Secondary | ICD-10-CM | POA: Diagnosis not present

## 2020-12-01 DIAGNOSIS — D500 Iron deficiency anemia secondary to blood loss (chronic): Secondary | ICD-10-CM | POA: Diagnosis not present

## 2020-12-04 DIAGNOSIS — R739 Hyperglycemia, unspecified: Secondary | ICD-10-CM | POA: Diagnosis not present

## 2020-12-04 DIAGNOSIS — Z13220 Encounter for screening for lipoid disorders: Secondary | ICD-10-CM | POA: Diagnosis not present

## 2020-12-04 DIAGNOSIS — Z20828 Contact with and (suspected) exposure to other viral communicable diseases: Secondary | ICD-10-CM | POA: Diagnosis not present

## 2020-12-04 DIAGNOSIS — K219 Gastro-esophageal reflux disease without esophagitis: Secondary | ICD-10-CM | POA: Diagnosis not present

## 2020-12-04 DIAGNOSIS — Z1329 Encounter for screening for other suspected endocrine disorder: Secondary | ICD-10-CM | POA: Diagnosis not present

## 2020-12-05 DIAGNOSIS — F419 Anxiety disorder, unspecified: Secondary | ICD-10-CM | POA: Diagnosis not present

## 2020-12-05 DIAGNOSIS — K219 Gastro-esophageal reflux disease without esophagitis: Secondary | ICD-10-CM | POA: Diagnosis not present

## 2020-12-05 DIAGNOSIS — I10 Essential (primary) hypertension: Secondary | ICD-10-CM | POA: Diagnosis not present

## 2020-12-05 DIAGNOSIS — Z1331 Encounter for screening for depression: Secondary | ICD-10-CM | POA: Diagnosis not present

## 2020-12-05 DIAGNOSIS — S39012A Strain of muscle, fascia and tendon of lower back, initial encounter: Secondary | ICD-10-CM | POA: Diagnosis not present

## 2020-12-05 DIAGNOSIS — H8110 Benign paroxysmal vertigo, unspecified ear: Secondary | ICD-10-CM | POA: Diagnosis not present

## 2020-12-05 DIAGNOSIS — Z1389 Encounter for screening for other disorder: Secondary | ICD-10-CM | POA: Diagnosis not present

## 2020-12-05 DIAGNOSIS — E7849 Other hyperlipidemia: Secondary | ICD-10-CM | POA: Diagnosis not present

## 2021-01-31 DIAGNOSIS — K219 Gastro-esophageal reflux disease without esophagitis: Secondary | ICD-10-CM | POA: Diagnosis not present

## 2021-01-31 DIAGNOSIS — I10 Essential (primary) hypertension: Secondary | ICD-10-CM | POA: Diagnosis not present

## 2021-01-31 DIAGNOSIS — E7849 Other hyperlipidemia: Secondary | ICD-10-CM | POA: Diagnosis not present

## 2021-01-31 DIAGNOSIS — D500 Iron deficiency anemia secondary to blood loss (chronic): Secondary | ICD-10-CM | POA: Diagnosis not present

## 2021-04-02 DIAGNOSIS — E7849 Other hyperlipidemia: Secondary | ICD-10-CM | POA: Diagnosis not present

## 2021-04-02 DIAGNOSIS — D500 Iron deficiency anemia secondary to blood loss (chronic): Secondary | ICD-10-CM | POA: Diagnosis not present

## 2021-04-02 DIAGNOSIS — K219 Gastro-esophageal reflux disease without esophagitis: Secondary | ICD-10-CM | POA: Diagnosis not present

## 2021-04-02 DIAGNOSIS — I10 Essential (primary) hypertension: Secondary | ICD-10-CM | POA: Diagnosis not present

## 2021-04-16 DIAGNOSIS — E7849 Other hyperlipidemia: Secondary | ICD-10-CM | POA: Diagnosis not present

## 2021-04-16 DIAGNOSIS — Z6831 Body mass index (BMI) 31.0-31.9, adult: Secondary | ICD-10-CM | POA: Diagnosis not present

## 2021-04-16 DIAGNOSIS — K219 Gastro-esophageal reflux disease without esophagitis: Secondary | ICD-10-CM | POA: Diagnosis not present

## 2021-04-16 DIAGNOSIS — S39012A Strain of muscle, fascia and tendon of lower back, initial encounter: Secondary | ICD-10-CM | POA: Diagnosis not present

## 2021-04-16 DIAGNOSIS — F419 Anxiety disorder, unspecified: Secondary | ICD-10-CM | POA: Diagnosis not present

## 2021-04-16 DIAGNOSIS — H8110 Benign paroxysmal vertigo, unspecified ear: Secondary | ICD-10-CM | POA: Diagnosis not present

## 2021-04-16 DIAGNOSIS — I10 Essential (primary) hypertension: Secondary | ICD-10-CM | POA: Diagnosis not present

## 2021-06-03 DIAGNOSIS — I10 Essential (primary) hypertension: Secondary | ICD-10-CM | POA: Diagnosis not present

## 2021-06-03 DIAGNOSIS — K219 Gastro-esophageal reflux disease without esophagitis: Secondary | ICD-10-CM | POA: Diagnosis not present

## 2021-06-03 DIAGNOSIS — D500 Iron deficiency anemia secondary to blood loss (chronic): Secondary | ICD-10-CM | POA: Diagnosis not present

## 2021-06-03 DIAGNOSIS — E7849 Other hyperlipidemia: Secondary | ICD-10-CM | POA: Diagnosis not present

## 2021-08-03 DIAGNOSIS — I10 Essential (primary) hypertension: Secondary | ICD-10-CM | POA: Diagnosis not present

## 2021-08-03 DIAGNOSIS — D500 Iron deficiency anemia secondary to blood loss (chronic): Secondary | ICD-10-CM | POA: Diagnosis not present

## 2021-08-03 DIAGNOSIS — E7849 Other hyperlipidemia: Secondary | ICD-10-CM | POA: Diagnosis not present

## 2021-08-03 DIAGNOSIS — K219 Gastro-esophageal reflux disease without esophagitis: Secondary | ICD-10-CM | POA: Diagnosis not present

## 2021-08-04 DIAGNOSIS — M545 Low back pain, unspecified: Secondary | ICD-10-CM | POA: Diagnosis not present

## 2021-08-04 DIAGNOSIS — Z6831 Body mass index (BMI) 31.0-31.9, adult: Secondary | ICD-10-CM | POA: Diagnosis not present

## 2021-08-04 DIAGNOSIS — H8110 Benign paroxysmal vertigo, unspecified ear: Secondary | ICD-10-CM | POA: Diagnosis not present

## 2021-08-04 DIAGNOSIS — E7849 Other hyperlipidemia: Secondary | ICD-10-CM | POA: Diagnosis not present

## 2021-08-04 DIAGNOSIS — K219 Gastro-esophageal reflux disease without esophagitis: Secondary | ICD-10-CM | POA: Diagnosis not present

## 2021-08-04 DIAGNOSIS — F419 Anxiety disorder, unspecified: Secondary | ICD-10-CM | POA: Diagnosis not present

## 2021-08-04 DIAGNOSIS — I10 Essential (primary) hypertension: Secondary | ICD-10-CM | POA: Diagnosis not present

## 2021-12-14 ENCOUNTER — Emergency Department (HOSPITAL_COMMUNITY): Payer: PPO

## 2021-12-14 ENCOUNTER — Encounter (HOSPITAL_COMMUNITY): Payer: Self-pay

## 2021-12-14 ENCOUNTER — Observation Stay (HOSPITAL_COMMUNITY)
Admission: EM | Admit: 2021-12-14 | Discharge: 2021-12-15 | Disposition: A | Discharge status: Home / Self Care | Payer: PPO | Attending: Internal Medicine | Admitting: Internal Medicine

## 2021-12-14 ENCOUNTER — Other Ambulatory Visit: Payer: Self-pay

## 2021-12-14 DIAGNOSIS — E871 Hypo-osmolality and hyponatremia: Secondary | ICD-10-CM | POA: Diagnosis not present

## 2021-12-14 DIAGNOSIS — R531 Weakness: Secondary | ICD-10-CM

## 2021-12-14 DIAGNOSIS — D72829 Elevated white blood cell count, unspecified: Secondary | ICD-10-CM

## 2021-12-14 DIAGNOSIS — R0609 Other forms of dyspnea: Secondary | ICD-10-CM

## 2021-12-14 DIAGNOSIS — Z79899 Other long term (current) drug therapy: Secondary | ICD-10-CM | POA: Insufficient documentation

## 2021-12-14 DIAGNOSIS — D72823 Leukemoid reaction: Secondary | ICD-10-CM | POA: Diagnosis not present

## 2021-12-14 DIAGNOSIS — Z20822 Contact with and (suspected) exposure to covid-19: Secondary | ICD-10-CM | POA: Diagnosis not present

## 2021-12-14 DIAGNOSIS — I10 Essential (primary) hypertension: Secondary | ICD-10-CM | POA: Diagnosis not present

## 2021-12-14 DIAGNOSIS — K219 Gastro-esophageal reflux disease without esophagitis: Secondary | ICD-10-CM | POA: Diagnosis present

## 2021-12-14 DIAGNOSIS — E876 Hypokalemia: Secondary | ICD-10-CM | POA: Diagnosis not present

## 2021-12-14 DIAGNOSIS — R42 Dizziness and giddiness: Secondary | ICD-10-CM | POA: Diagnosis present

## 2021-12-14 LAB — CBC WITH DIFFERENTIAL/PLATELET
Abs Immature Granulocytes: 0.12 10*3/uL — ABNORMAL HIGH (ref 0.00–0.07)
Basophils Absolute: 0.1 10*3/uL (ref 0.0–0.1)
Basophils Relative: 0 %
Eosinophils Absolute: 0.5 10*3/uL (ref 0.0–0.5)
Eosinophils Relative: 3 %
HCT: 43.1 % (ref 36.0–46.0)
Hemoglobin: 14.4 g/dL (ref 12.0–15.0)
Immature Granulocytes: 1 %
Lymphocytes Relative: 27 %
Lymphs Abs: 4.7 10*3/uL — ABNORMAL HIGH (ref 0.7–4.0)
MCH: 28 pg (ref 26.0–34.0)
MCHC: 33.4 g/dL (ref 30.0–36.0)
MCV: 83.9 fL (ref 80.0–100.0)
Monocytes Absolute: 1.5 10*3/uL — ABNORMAL HIGH (ref 0.1–1.0)
Monocytes Relative: 9 %
Neutro Abs: 10.5 10*3/uL — ABNORMAL HIGH (ref 1.7–7.7)
Neutrophils Relative %: 60 %
Platelets: 290 10*3/uL (ref 150–400)
RBC: 5.14 MIL/uL — ABNORMAL HIGH (ref 3.87–5.11)
RDW: 14.1 % (ref 11.5–15.5)
WBC: 17.3 10*3/uL — ABNORMAL HIGH (ref 4.0–10.5)
nRBC: 0 % (ref 0.0–0.2)

## 2021-12-14 LAB — URINALYSIS, COMPLETE (UACMP) WITH MICROSCOPIC
Bilirubin Urine: NEGATIVE
Glucose, UA: NEGATIVE mg/dL
Hgb urine dipstick: NEGATIVE
Ketones, ur: NEGATIVE mg/dL
Nitrite: NEGATIVE
Protein, ur: NEGATIVE mg/dL
Specific Gravity, Urine: 1.008 (ref 1.005–1.030)
pH: 7 (ref 5.0–8.0)

## 2021-12-14 LAB — LACTIC ACID, PLASMA: Lactic Acid, Venous: 1.5 mmol/L (ref 0.5–1.9)

## 2021-12-14 LAB — COMPREHENSIVE METABOLIC PANEL
ALT: 16 U/L (ref 0–44)
AST: 16 U/L (ref 15–41)
Albumin: 3.8 g/dL (ref 3.5–5.0)
Alkaline Phosphatase: 76 U/L (ref 38–126)
Anion gap: 13 (ref 5–15)
BUN: 20 mg/dL (ref 8–23)
CO2: 29 mmol/L (ref 22–32)
Calcium: 9.4 mg/dL (ref 8.9–10.3)
Chloride: 88 mmol/L — ABNORMAL LOW (ref 98–111)
Creatinine, Ser: 0.99 mg/dL (ref 0.44–1.00)
GFR, Estimated: 57 mL/min — ABNORMAL LOW (ref >60)
Glucose, Bld: 177 mg/dL — ABNORMAL HIGH (ref 70–99)
Potassium: 2.3 mmol/L — CL (ref 3.5–5.1)
Sodium: 130 mmol/L — ABNORMAL LOW (ref 135–145)
Total Bilirubin: 0.9 mg/dL (ref 0.3–1.2)
Total Protein: 7.2 g/dL (ref 6.5–8.1)

## 2021-12-14 LAB — PROCALCITONIN: Procalcitonin: <0.1 ng/mL

## 2021-12-14 LAB — RESP PANEL BY RT-PCR (FLU A&B, COVID) ARPGX2
Influenza A by PCR: NEGATIVE
Influenza B by PCR: NEGATIVE
SARS Coronavirus 2 by RT PCR: NEGATIVE

## 2021-12-14 LAB — TSH: TSH: 0.561 u[IU]/mL (ref 0.350–4.500)

## 2021-12-14 LAB — VITAMIN B12: Vitamin B-12: 418 pg/mL (ref 180–914)

## 2021-12-14 LAB — FOLATE: Folate: 11.3 ng/mL (ref >5.9)

## 2021-12-14 LAB — T4, FREE: Free T4: 2.04 ng/dL — ABNORMAL HIGH (ref 0.61–1.12)

## 2021-12-14 MED ORDER — ACETAMINOPHEN 650 MG RE SUPP
650.0000 mg | Freq: Four times a day (QID) | RECTAL | Status: DC | PRN
Start: 2021-12-14 — End: 2021-12-15

## 2021-12-14 MED ORDER — PANTOPRAZOLE SODIUM 40 MG PO TBEC
40.0000 mg | DELAYED_RELEASE_TABLET | Freq: Two times a day (BID) | ORAL | Status: DC
  Administered 2021-12-14 – 2021-12-15 (×2): 40 mg via ORAL
  Filled 2021-12-14 (×2): qty 1

## 2021-12-14 MED ORDER — POTASSIUM CHLORIDE 10 MEQ/100ML IV SOLN
10.0000 meq | INTRAVENOUS | Status: AC
  Administered 2021-12-14 (×3): 10 meq via INTRAVENOUS
  Filled 2021-12-14 (×2): qty 100

## 2021-12-14 MED ORDER — ONDANSETRON HCL 4 MG/2ML IJ SOLN: 4.0000 mg | Freq: Four times a day (QID) | INTRAMUSCULAR | Status: DC | PRN

## 2021-12-14 MED ORDER — POTASSIUM CHLORIDE CRYS ER 20 MEQ PO TBCR
40.0000 meq | EXTENDED_RELEASE_TABLET | Freq: Once | ORAL | Status: AC
  Administered 2021-12-14: 40 meq via ORAL
  Filled 2021-12-14: qty 2

## 2021-12-14 MED ORDER — MECLIZINE HCL 12.5 MG PO TABS: 25.0000 mg | ORAL_TABLET | Freq: Three times a day (TID) | ORAL | Status: DC | PRN

## 2021-12-14 MED ORDER — ONDANSETRON HCL 4 MG PO TABS: 4.0000 mg | ORAL_TABLET | Freq: Four times a day (QID) | ORAL | Status: DC | PRN

## 2021-12-14 MED ORDER — POTASSIUM CHLORIDE 10 MEQ/100ML IV SOLN
INTRAVENOUS | Status: AC
  Filled 2021-12-14: qty 100

## 2021-12-14 MED ORDER — ACETAMINOPHEN 325 MG PO TABS: 650.0000 mg | ORAL_TABLET | Freq: Four times a day (QID) | ORAL | Status: DC | PRN

## 2021-12-14 MED ORDER — EZETIMIBE 10 MG PO TABS
10.0000 mg | ORAL_TABLET | Freq: Every day | ORAL | Status: DC
  Administered 2021-12-14 – 2021-12-15 (×2): 10 mg via ORAL
  Filled 2021-12-14 (×2): qty 1

## 2021-12-14 MED ORDER — ENOXAPARIN SODIUM 40 MG/0.4ML IJ SOSY
40.0000 mg | PREFILLED_SYRINGE | INTRAMUSCULAR | Status: DC
  Administered 2021-12-14: 40 mg via SUBCUTANEOUS
  Filled 2021-12-14: qty 0.4

## 2021-12-14 MED ORDER — POTASSIUM CHLORIDE IN NACL 20-0.9 MEQ/L-% IV SOLN: INTRAVENOUS | Status: DC

## 2021-12-14 MED ORDER — ALPRAZOLAM 0.25 MG PO TABS: 0.2500 mg | ORAL_TABLET | Freq: Every evening | ORAL | Status: DC | PRN

## 2021-12-14 MED ORDER — POTASSIUM CHLORIDE 10 MEQ/100ML IV SOLN
10.0000 meq | Freq: Once | INTRAVENOUS | Status: AC
  Administered 2021-12-14: 10 meq via INTRAVENOUS
  Filled 2021-12-14: qty 100

## 2021-12-14 NOTE — Assessment & Plan Note (Signed)
Echocardiogram Stable on room air

## 2021-12-14 NOTE — Assessment & Plan Note (Addendum)
Primarily due to volume depletion, hyponatremia, hypokalemia Obtain UA TSH PT evaluation B12 Check orthostatics

## 2021-12-14 NOTE — H&P (Signed)
?History and Physical  ? ? ?Patient: Sara [name] DOB: [birth year] ?DOA: 12/14/2021 ?DOS: the patient was seen and examined on 12/14/2021 ?PCP: Royann Shivers, PA-C  ?Patient coming from: Home ? ?Chief Complaint:  ?Chief Complaint  ?Patient presents with  ? Dizziness  ? ?HPI: Sara [name] is a 81 year old female with a history of iron deficiency anemia, GERD, hypertension presenting with some generalized weakness and dizziness in the morning of 12/14/2021.  The patient states that she has been struggling with URI type symptoms with chest congestion, nonproductive cough, and dyspnea on exertion for about 10 days she went to her PCP on 12/10/2021.  She was given some type of steroid injection and started on cefprozil on which she continues.  She states that she has had an episode of nausea and vomiting on 12/10/2021, but has not had any vomiting since then.  She states that her oral intake has been poor during this period of time.  However, she states that overall she feels that her chest congestion and cough are improving. ?However, on the morning of 12/14/2021, the patient was sitting her recliner when she felt dizziness and presyncopal.  She checked her blood pressure on multiple occasions.  Systolic blood pressure was as high as 166.  She had already taken her usual diltiazem and HCTZ on the evening of 12/13/2021.  She decided that she was going to take her spouse's atenolol 25 mg because of her elevated blood pressure.  In addition, she took aspirin 325 mg x 1.  She continues to have some dizziness.  As result, she presented for further evaluation and treatment.  Prior to the day of admission, the patient states that she has not had any dizziness, chest discomfort, fevers, chills, headache, hemoptysis, diarrhea, abdominal pain.  She has not had any dysuria or hematuria.  Other than the episode of nausea and vomiting on 12/10/2021 she has not had any further vomiting.  She denies any other  over-the-counter medications.  She denies any focal extremity weakness, dysarthria, or sensory disturbance. ?In the ED, the patient was afebrile hemodynamically stable with soft blood pressures with systolic 100-110.  Oxygen saturation was 95-96% on room air.  BMP showed sodium 130, potassium 2.3, bicarbonate 29, BUN 20, serum creatinine 0.99.  WBC 17.3, hemoglobin 14.4, platelets 290K.  Chest x-ray showed large hiatal hernia and left basilar atelectasis.  EKG showed sinus rhythm without any acute ST wave changes.  The patient was admitted because of her severe hypokalemia and further work-up. ? ?Review of Systems: As mentioned in the history of present illness. All other systems reviewed and are negative. ?Past Medical History:  ?Diagnosis Date  ? Anemia   ? IDA  ? GERD (gastroesophageal reflux disease)   ? Hernia of unspecified site of abdominal cavity without mention of obstruction or gangrene   ? Hypertension   ? Macular degeneration   ? ?Past Surgical History:  ?Procedure Laterality Date  ? ABDOMINAL HYSTERECTOMY    ? CHOLECYSTECTOMY    ? COLONOSCOPY    ? ESOPHAGOGASTRODUODENOSCOPY  12/21/2011  ? Procedure: ESOPHAGOGASTRODUODENOSCOPY (EGD);  Surgeon: Malissa Hippo, MD;  Location: [facility];  Service: Endoscopy;  Laterality: N/A;  730  ? ORIF ANKLE FRACTURE    ? right ankle  ? UPPER GASTROINTESTINAL ENDOSCOPY    ? ?Social History:  reports that she has never smoked. She has never used smokeless tobacco. She reports that she does not currently use alcohol. She reports that she does not  use drugs. ? ?No Known Allergies ? ?Family History  ?Problem Relation Age of Onset  ? Heart disease Mother   ? Aneurysm Father   ? Pancreatic cancer Brother   ? Healthy Daughter   ? Healthy Daughter   ? Healthy Daughter   ? Healthy Son   ? Healthy Son   ? Anesthesia problems Neg Hx   ? Hypotension Neg Hx   ? Malignant hyperthermia Neg Hx   ? Pseudochol deficiency Neg Hx   ? ? ?Prior to Admission medications   ?Medication Sig  Start Date End Date Taking? Authorizing Provider  ?ALPRAZolam (XANAX) 0.25 MG tablet Take 0.25 mg by mouth at bedtime as needed for anxiety.   Yes [provider]  ?cefPROZIL (CEFZIL) 250 MG tablet Take 250 mg by mouth 2 (two) times daily. 12/10/21  Yes [provider]  ?diltiazem (DILACOR XR) 120 MG 24 hr capsule Take 120 mg by mouth every evening.    Yes [provider]  ?ezetimibe (ZETIA) 10 MG tablet Take 10 mg by mouth daily. 11/14/21  Yes [provider]  ?hydrochlorothiazide (HYDRODIURIL) 25 MG tablet Take 25 mg by mouth daily. 11/27/21  Yes [provider]  ?HYDROcodone-acetaminophen (NORCO) 5-325 MG per tablet Take 1 tablet by mouth every 4 (four) hours as needed for moderate pain. 02/21/12  Yes [provider]  ?meclizine (ANTIVERT) 25 MG tablet Take 25 mg by mouth 3 (three) times daily as needed for dizziness.   Yes [provider]  ?ondansetron (ZOFRAN) 4 MG tablet Take 1 tablet (4 mg total) by mouth every 8 (eight) hours as needed for nausea or vomiting. 07/14/15  Yes Setzer, Terri L, NP  ?pantoprazole (PROTONIX) 40 MG tablet TAKE (1) TABLET TWICE DAILY. 06/24/15  Yes Setzer, Terri L, NP  ?pantoprazole (PROTONIX) 40 MG tablet TAKE (1) TABLET TWICE DAILY. ?Patient not taking: Reported on 12/14/2021 09/22/15   Len BlalockSetzer, Terri L, NP  ? ? ?Physical Exam: ?Vitals:  ? 12/14/21 1400 12/14/21 1430 12/14/21 1500 12/14/21 1530  ?BP: 107/72 112/77 124/86 104/72  ?Pulse: 71 72 73 72  ?Resp: (!) 22 18 18 13   ?Temp:      ?SpO2: 95% 94% 96% 95%  ?Weight:      ?Height:      ? ?GENERAL:  A&O x 3, NAD, well developed, cooperative, follows commands ?HEENT: [place]/AT, No thrush, No icterus, No oral ulcers ?Neck:  No neck mass, No meningismus, soft, supple ?CV: RRR, no S3, no S4, no rub, no JVD ?Lungs:  L-basilar crackles, no wheeze, no rhonchi, good air movement ?Abd: soft/NT +BS, nondistended ?Ext: No edema, no lymphangitis, no cyanosis, no rashes ?Neuro:  CN II-XII  intact, strength 4/5 in RUE, RLE, strength 4/5 LUE, LLE; sensation intact bilateral; no dysmetria; babinski equivocal ? ?Data Reviewed: ?Data reviewed in history ? ?Assessment and Plan: ?* Hypokalemia ?Due to poor oral intake and HCTZ ?Hold HCTZ ?Replete ?Check magnesium ?Remain on telemetry ?Add potassium to IV fluids ? ?Generalized weakness ?Primarily due to volume depletion, hyponatremia, hypokalemia ?Obtain UA ?TSH ?PT evaluation ?B12 ?Check orthostatics ? ?Dyspnea on exertion ?Echocardiogram ?Stable on room air ? ?Leukemoid reaction ?May be partly attributed to recent steroid injection ?Check lactate ?Check PCT ?Obtain UA ?Chest x-ray without infiltrates ? ?Hyponatremia ?Secondary to poor solute intake and volume depletion ?Start normal saline ? ?Essential hypertension ?Holding diltiazem today as pt took her spouse's atenolol this am 3/13 ?Holding HCTZ ? ?GERD (gastroesophageal reflux disease) ?Continue pantoprazole ? ? ? ? ?  Advance Care Planning: FULL CODE ? ?Consults: none ? ?Family Communication: spouse updated 3/13 ? ?Severity of Illness: ?The appropriate patient status for this patient is OBSERVATION. Observation status is judged to be reasonable and necessary in order to provide the required intensity of service to ensure the patient's safety. The patient's presenting symptoms, physical exam findings, and initial radiographic and laboratory data in the context of their medical condition is felt to place them at decreased risk for further clinical deterioration. Furthermore, it is anticipated that the patient will be medically stable for discharge from the hospital within 2 midnights of admission.  ? ?Author: ?Catarina Hartshorn, MD ?12/14/2021 3:49 PM ? ?For on call review [email or web address].  ?

## 2021-12-14 NOTE — Assessment & Plan Note (Addendum)
Secondary to poor solute intake and volume depletion Start normal saline>>improved

## 2021-12-14 NOTE — ED Provider Notes (Incomplete)
[facility] EMERGENCY DEPARTMENT Provider Note   CSN: [id number] Arrival date & time: 12/14/21  1043     History {Add pertinent medical, surgical, social history, OB history to HPI:1} Chief Complaint  Patient presents with   Dizziness    Sara [name] is a 81 y.o. female.  Patient complains of dizziness.  She has had a cough for a few days and has had some steroids and antibiotics.  Patient has a history of hypertension   Dizziness     Home Medications Prior to Admission medications   Medication Sig Start Date End Date Taking? Authorizing Provider  ALPRAZolam Prudy Feeler) 0.25 MG tablet Take 0.25 mg by mouth at bedtime as needed for anxiety.   Yes [provider]  cefPROZIL (CEFZIL) 250 MG tablet Take 250 mg by mouth 2 (two) times daily. 12/10/21  Yes [provider]  diltiazem (DILACOR XR) 120 MG 24 hr capsule Take 120 mg by mouth every evening.    Yes [provider]  ezetimibe (ZETIA) 10 MG tablet Take 10 mg by mouth daily. 11/14/21  Yes [provider]  hydrochlorothiazide (HYDRODIURIL) 25 MG tablet Take 25 mg by mouth daily. 11/27/21  Yes [provider]  HYDROcodone-acetaminophen (NORCO) 5-325 MG per tablet Take 1 tablet by mouth every 4 (four) hours as needed for moderate pain. 02/21/12  Yes [provider]  meclizine (ANTIVERT) 25 MG tablet Take 25 mg by mouth 3 (three) times daily as needed for dizziness.   Yes [provider]  ondansetron (ZOFRAN) 4 MG tablet Take 1 tablet (4 mg total) by mouth every 8 (eight) hours as needed for nausea or vomiting. 07/14/15  Yes Setzer, Terri L, NP  pantoprazole (PROTONIX) 40 MG tablet TAKE (1) TABLET TWICE DAILY. 06/24/15  Yes Setzer, Terri L, NP  pantoprazole (PROTONIX) 40 MG tablet TAKE (1) TABLET TWICE DAILY. Patient not taking: Reported on 12/14/2021 09/22/15   Len Blalock, NP      Allergies    Patient has no known allergies.    Review of Systems   Review of Systems   Neurological:  Positive for dizziness.   Physical Exam Updated Vital Signs BP 112/77    Pulse 72    Temp 97.7 F (36.5 C)    Resp 18    Ht 5\' 2"  (1.575 m)    Wt 75.3 kg    SpO2 94%    BMI 30.36 kg/m  Physical Exam  ED Results / Procedures / Treatments   Labs (all labs ordered are listed, but only abnormal results are displayed) Labs Reviewed  CBC WITH DIFFERENTIAL/PLATELET - Abnormal; Notable for the following components:      Result Value   WBC 17.3 (*)    RBC 5.14 (*)    Neutro Abs 10.5 (*)    Lymphs Abs 4.7 (*)    Monocytes Absolute 1.5 (*)    Abs Immature Granulocytes 0.12 (*)    All other components within normal limits  COMPREHENSIVE METABOLIC PANEL - Abnormal; Notable for the following components:   Sodium 130 (*)    Potassium 2.3 (*)    Chloride 88 (*)    Glucose, Bld 177 (*)    GFR, Estimated 57 (*)    All other components within normal limits  RESP PANEL BY RT-PCR (FLU A&B, COVID) ARPGX2  URINALYSIS, ROUTINE W REFLEX MICROSCOPIC    EKG EKG Interpretation  Date/Time:  Monday December 14 2021 11:11:23 EDT Ventricular Rate:  102 PR Interval:  196 QRS  Duration: 98 QT Interval:  354 QTC Calculation: 462 R Axis:   -32 Text Interpretation: Sinus tachycardia Atrial premature complexes Left axis deviation Low voltage, precordial leads RSR' in V1 or V2, right VCD or RVH Nonspecific T abnormalities, lateral leads Confirmed by Bethann Berkshire [phone]) on 12/14/2021 2:32:34 PM  Radiology DG Chest Port 1 View  Result Date: 12/14/2021 CLINICAL DATA:  Dizziness.  Shortness of breath. EXAM: PORTABLE CHEST 1 VIEW COMPARISON:  02/07/2015 chest radiograph and 02/26/2016 upper GI. FINDINGS: Midline trachea. Moderate cardiomegaly. Large hiatal hernia again identified. This may be slightly increased. No pleural effusion or pneumothorax. The right lung is clear. The left lung base is poorly evaluated secondary to the overlying hiatal hernia. There is likely volume loss in the adjacent  left lung base. Left apex and upper lung are clear. IMPRESSION: Large hiatal hernia, possibly increased compared to 02/07/2015. This degrades evaluation of the left lung base, which has probable volume loss and atelectasis. Cardiomegaly without congestive failure. Electronically Signed   By: Jeronimo Greaves M.D.   On: 12/14/2021 11:49    Procedures Procedures  {Document cardiac monitor, telemetry assessment procedure when appropriate:1}  Medications Ordered in ED Medications  potassium chloride 10 mEq in 100 mL IVPB (10 mEq Intravenous New Bag/Given 12/14/21 1432)  potassium chloride SA (KLOR-CON M) CR tablet 40 mEq (40 mEq Oral Given 12/14/21 1427)    ED Course/ Medical Decision Making/ A&P  CRITICAL CARE Performed by: Bethann Berkshire Total critical care time: 40 minutes Critical care time was exclusive of separately billable procedures and treating other patients. Critical care was necessary to treat or prevent imminent or life-threatening deterioration. Critical care was time spent personally by me on the following activities: development of treatment plan with patient and/or surrogate as well as nursing, discussions with consultants, evaluation of patient's response to treatment, examination of patient, obtaining history from patient or surrogate, ordering and performing treatments and interventions, ordering and review of laboratory studies, ordering and review of radiographic studies, pulse oximetry and re-evaluation of patient's condition.                          Medical Decision Making Amount and/or Complexity of Data Reviewed Labs: ordered. Radiology: ordered. ECG/medicine tests: ordered.  Risk Prescription drug management. Decision regarding hospitalization.   Patient with hypokalemia and hypertension.  He will be admitted for blood pressure control and potassium  {Document critical care time when appropriate:1} {Document review of labs and clinical decision tools ie heart  score, Chads2Vasc2 etc:1}  {Document your independent review of radiology images, and any outside records:1} {Document your discussion with family members, caretakers, and with consultants:1} {Document social determinants of health affecting pt's care:1} {Document your decision making why or why not admission, treatments were needed:1} Final Clinical Impression(s) / ED Diagnoses Final diagnoses:  Hypokalemia    Rx / DC Orders ED Discharge Orders     None

## 2021-12-14 NOTE — Assessment & Plan Note (Signed)
Continue pantoprazole. °

## 2021-12-14 NOTE — Hospital Course (Signed)
81 year old female with a history of iron deficiency anemia, GERD, hypertension presenting with some generalized weakness and dizziness in the morning of 12/14/2021.  The patient states that she has been struggling with URI type symptoms with chest congestion, nonproductive cough, and dyspnea on exertion for about 10 days she went to her PCP on 12/10/2021.  She was given some type of steroid injection and started on cefprozil on which she continues.  She states that she has had an episode of nausea and vomiting on 12/10/2021, but has not had any vomiting since then.  She states that her oral intake has been poor during this period of time.  However, she states that overall she feels that her chest congestion and cough are improving. However, on the morning of 12/14/2021, the patient was sitting her recliner when she felt dizziness and presyncopal.  She checked her blood pressure on multiple occasions.  Systolic blood pressure was as high as 166.  She had already taken her usual diltiazem and HCTZ on the evening of 12/13/2021.  She decided that she was going to take her spouse's atenolol 25 mg because of her elevated blood pressure.  In addition, she took aspirin 325 mg x 1.  She continues to have some dizziness.  As result, she presented for further evaluation and treatment.  Prior to the day of admission, the patient states that she has not had any dizziness, chest discomfort, fevers, chills, headache, hemoptysis, diarrhea, abdominal pain.  She has not had any dysuria or hematuria.  Other than the episode of nausea and vomiting on 12/10/2021 she has not had any further vomiting.  She denies any other over-the-counter medications.  She denies any focal extremity weakness, dysarthria, or sensory disturbance. In the ED, the patient was afebrile hemodynamically stable with soft blood pressures with systolic [id number].  Oxygen saturation was 95-96% on room air.  BMP showed sodium 130, potassium 2.3, bicarbonate 29, BUN 20, serum  creatinine 0.99.  WBC 17.3, hemoglobin 14.4, platelets 290K.  Chest x-ray showed large hiatal hernia and left basilar atelectasis.  EKG showed sinus rhythm without any acute ST wave changes.  The patient was admitted because of her severe hypokalemia and further work-up.

## 2021-12-14 NOTE — Assessment & Plan Note (Addendum)
Due to poor oral intake and HCTZ Hold HCTZ Replete Check magnesium Remain on telemetry Add potassium to IV fluids

## 2021-12-14 NOTE — Assessment & Plan Note (Addendum)
Holding diltiazem today as pt took her spouse's atenolol this am 3/13 Holding HCTZ

## 2021-12-14 NOTE — ED Triage Notes (Signed)
Patient states that she was sitting in chair this morning and felt dizzy. States that BP has been elevated and it is going up and down now when checking at home. Took ASA PTA.  ?

## 2021-12-14 NOTE — Assessment & Plan Note (Addendum)
May be partly attributed to recent steroid injection Check lactate--1.5 Check PCT <0.10 Obtain UA--no pyuria Chest x-ray without infiltrates--personally reviewed

## 2021-12-15 LAB — BASIC METABOLIC PANEL
Anion gap: 9 (ref 5–15)
BUN: 19 mg/dL (ref 8–23)
CO2: 27 mmol/L (ref 22–32)
Calcium: 8.6 mg/dL — ABNORMAL LOW (ref 8.9–10.3)
Chloride: 98 mmol/L (ref 98–111)
Creatinine, Ser: 0.84 mg/dL (ref 0.44–1.00)
GFR, Estimated: >60 mL/min (ref >60)
Glucose, Bld: 113 mg/dL — ABNORMAL HIGH (ref 70–99)
Potassium: 3.8 mmol/L (ref 3.5–5.1)
Sodium: 134 mmol/L — ABNORMAL LOW (ref 135–145)

## 2021-12-15 LAB — MAGNESIUM: Magnesium: 2 mg/dL (ref 1.7–2.4)

## 2021-12-15 NOTE — Progress Notes (Signed)
?  Transition of Care (TOC) Screening Note ? ? ?Patient Details  ?Name: Sara [name] ?Date of Birth: 1941/08/18 ? ? ?Transition of Care (TOC) CM/SW Contact:    ?Villa Herb, LCSWA ?Phone Number: ?12/15/2021, 10:33 AM ? ? ? ?Transition of Care Department [facility]) has reviewed patient and no TOC needs have been identified at this time. We will continue to monitor patient advancement through interdisciplinary progression rounds. If new patient transition needs arise, please place a TOC consult. ?  ?

## 2021-12-15 NOTE — Evaluation (Signed)
Physical Therapy Evaluation ?Patient Details ?Name: Sara [name] ?MRN: [id number] ?DOB: 05/30/1941 ?Today's Date: 12/15/2021 ? ?History of Present Illness ? Sara [name] is a 81 year old female with a history of iron deficiency anemia, GERD, hypertension presenting with some generalized weakness and dizziness in the morning of 12/14/2021.  The patient states that she has been struggling with URI type symptoms with chest congestion, nonproductive cough, and dyspnea on exertion for about 10 days she went to her PCP on 12/10/2021.  She was given some type of steroid injection and started on cefprozil on which she continues.  She states that she has had an episode of nausea and vomiting on 12/10/2021, but has not had any vomiting since then.  She states that her oral intake has been poor during this period of time.  However, she states that overall she feels that her chest congestion and cough are improving.  However, on the morning of 12/14/2021, the patient was sitting her recliner when she felt dizziness and presyncopal.  She checked her blood pressure on multiple occasions.  Systolic blood pressure was as high as 166.  She had already taken her usual diltiazem and HCTZ on the evening of 12/13/2021.  She decided that she was going to take her spouse's atenolol 25 mg because of her elevated blood pressure.  In addition, she took aspirin 325 mg x 1.  She continues to have some dizziness.  As result, she presented for further evaluation and treatment.  Prior to the day of admission, the patient states that she has not had any dizziness, chest discomfort, fevers, chills, headache, hemoptysis, diarrhea, abdominal pain.  She has not had any dysuria or hematuria.  Other than the episode of nausea and vomiting on 12/10/2021 she has not had any further vomiting.  She denies any other over-the-counter medications.  She denies any focal extremity weakness, dysarthria, or sensory disturbance. ?  ?Clinical Impression ? Patient  functioning near baseline for functional mobility and gait demonstrating good return for bed mobility, transferring to/from commode in bathroom and ambulation in room and hallway without loss of balance.  Plan:  Patient discharged from physical therapy to care of nursing for ambulation daily as tolerated for length of stay.  ?   ?   ? ?Recommendations for follow up therapy are one component of a multi-disciplinary discharge planning process, led by the attending physician.  Recommendations may be updated based on patient status, additional functional criteria and insurance authorization. ? ?Follow Up Recommendations No PT follow up ? ?  ?Assistance Recommended at Discharge PRN  ?Patient can return home with the following ? Help with stairs or ramp for entrance;Assistance with cooking/housework;Other (comment) (patient near baseline) ? ?  ?Equipment Recommendations None recommended by PT  ?Recommendations for Other Services ?    ?  ?Functional Status Assessment Patient has not had a recent decline in their functional status  ? ?  ?Precautions / Restrictions Precautions ?Precautions: None ?Restrictions ?Weight Bearing Restrictions: No  ? ?  ? ?Mobility ? Bed Mobility ?Overal bed mobility: Modified Independent ?  ?  ?  ?  ?  ?  ?  ?  ? ?Transfers ?Overall transfer level: Modified independent ?  ?  ?  ?  ?  ?  ?  ?  ?  ?  ? ?Ambulation/Gait ?Ambulation/Gait assistance: Modified independent (Device/Increase time) ?Gait Distance (Feet): 100 Feet ?Assistive device: None ?Gait Pattern/deviations: WFL(Within Functional Limits) ?Gait velocity: decreased ?  ?  ?General Gait Details: [facility]  with slightly labored cadence without loss of balance and good return for ambulation in room and hallway ? ?Stairs ?  ?  ?  ?  ?  ? ?Wheelchair Mobility ?  ? ?Modified Rankin (Stroke Patients Only) ?  ? ?  ? ?Balance Overall balance assessment: Mild deficits observed, not formally tested ?  ?  ?  ?  ?  ?  ?  ?  ?  ?  ?  ?  ?  ?  ?  ?  ?   ?  ?   ? ? ? ?Pertinent Vitals/Pain Pain Assessment ?Pain Assessment: No/denies pain  ? ? ?Home Living Family/patient expects to be discharged to:: Private residence ?Living Arrangements: Spouse/significant other ?Available Help at Discharge: Family;Available 24 hours/day ?Type of Home: Mobile home ?Home Access: Stairs to enter ?Entrance Stairs-Rails: Right;Left;Can reach both ?Entrance Stairs-Number of Steps: 2 ?  ?Home Layout: One level ?Home Equipment: Agricultural consultant (2 wheels);Cane - single point;BSC/3in1 ?   ?  ?Prior Function Prior Level of Function : Independent/Modified Independent ?  ?  ?  ?  ?  ?  ?Mobility Comments: Community ambulator without AD, drives ?ADLs Comments: Independent ?  ? ? ?Hand Dominance  ?   ? ?  ?Extremity/Trunk Assessment  ? Upper Extremity Assessment ?Upper Extremity Assessment: Overall WFL for tasks assessed ?  ? ?Lower Extremity Assessment ?Lower Extremity Assessment: Overall WFL for tasks assessed ?  ? ?Cervical / Trunk Assessment ?Cervical / Trunk Assessment: Kyphotic  ?Communication  ? Communication: No difficulties  ?Cognition Arousal/Alertness: Awake/alert ?Behavior During Therapy: [facility] for tasks assessed/performed ?Overall Cognitive Status: Within Functional Limits for tasks assessed ?  ?  ?  ?  ?  ?  ?  ?  ?  ?  ?  ?  ?  ?  ?  ?  ?  ?  ?  ? ?  ?General Comments   ? ?  ?Exercises    ? ?Assessment/Plan  ?  ?PT Assessment Patient does not need any further PT services  ?PT Problem List   ? ?   ?  ?PT Treatment Interventions     ? ?PT Goals (Current goals can be found in the Care Plan section)  ?Acute Rehab PT Goals ?Patient Stated Goal: return  home with family to assist ?PT Goal Formulation: With patient/family ?Time For Goal Achievement: 12/15/21 ?Potential to Achieve Goals: Good ? ?  ?Frequency   ?  ? ? ?Co-evaluation   ?  ?  ?  ?  ? ? ?  ?AM-PAC PT "6 Clicks" Mobility  ?Outcome Measure Help needed turning from your back to your side while in a flat bed without using  bedrails?: None ?Help needed moving from lying on your back to sitting on the side of a flat bed without using bedrails?: None ?Help needed moving to and from a bed to a chair (including a wheelchair)?: None ?Help needed standing up from a chair using your arms (e.g., wheelchair or bedside chair)?: None ?Help needed to walk in hospital room?: None ?Help needed climbing 3-5 steps with a railing? : A Little ?6 Click Score: 23 ? ?  ?End of Session   ?Activity Tolerance: Patient tolerated treatment well;Patient limited by fatigue ?Patient left: in bed;with call bell/phone within reach;with family/visitor present ?Nurse Communication: Mobility status ?PT Visit Diagnosis: Unsteadiness on feet (R26.81);Other abnormalities of gait and mobility (R26.89);Muscle weakness (generalized) (M62.81) ?  ? ?Time: 1740-8144 ?PT Time Calculation (min) (ACUTE ONLY): 25 min ? ? ?  Charges:   PT Evaluation ?$PT Eval Moderate Complexity: 1 Mod ?PT Treatments ?$Therapeutic Activity: 23-37 mins ?  ?   ? ? ?1:51 PM, 12/15/21 ?Ocie Bob, MPT ?Physical Therapist with [place] ?[facility] ?[phone] office ?[phone] mobile phone ? ? ?

## 2021-12-15 NOTE — Progress Notes (Signed)
Alert and verbal. Able to make needs known to staff. Discharge summary and education provided to pt who voiced understanding. Pt belongings and discharge summary given to pt and spouse in the room. Pt propelled to entrance of hospital and assisted to personal vehicle driven by spouse. No distress noted at time of discharge. ?

## 2021-12-15 NOTE — Discharge Summary (Addendum)
Physician Discharge Summary   Patient: Sara [name] MRN: [id number] DOB: Sep 01, 1941  Admit date:     12/14/2021  Discharge date: 12/15/21  Discharge Physician: Onalee Hua Alexes Menchaca   PCP: Royann Shivers, PA-C   Recommendations at discharge:   Please follow up with primary care provider within 1-2 weeks  Please repeat BMP and CBC in one week      Hospital Course: 81 year old female with a history of iron deficiency anemia, GERD, hypertension presenting with some generalized weakness and dizziness in the morning of 12/14/2021.  The patient states that she has been struggling with URI type symptoms with chest congestion, nonproductive cough, and dyspnea on exertion for about 10 days she went to her PCP on 12/10/2021.  She was given some type of steroid injection and started on cefprozil on which she continues.  She states that she has had an episode of nausea and vomiting on 12/10/2021, but has not had any vomiting since then.  She states that her oral intake has been poor during this period of time.  However, she states that overall she feels that her chest congestion and cough are improving. However, on the morning of 12/14/2021, the patient was sitting her recliner when she felt dizziness and presyncopal.  She checked her blood pressure on multiple occasions.  Systolic blood pressure was as high as 166.  She had already taken her usual diltiazem and HCTZ on the evening of 12/13/2021.  She decided that she was going to take her spouse's atenolol 25 mg because of her elevated blood pressure.  In addition, she took aspirin 325 mg x 1.  She continues to have some dizziness.  As result, she presented for further evaluation and treatment.  Prior to the day of admission, the patient states that she has not had any dizziness, chest discomfort, fevers, chills, headache, hemoptysis, diarrhea, abdominal pain.  She has not had any dysuria or hematuria.  Other than the episode of nausea and vomiting on 12/10/2021 she has  not had any further vomiting.  She denies any other over-the-counter medications.  She denies any focal extremity weakness, dysarthria, or sensory disturbance. In the ED, the patient was afebrile hemodynamically stable with soft blood pressures with systolic 100-110.  Oxygen saturation was 95-96% on room air.  BMP showed sodium 130, potassium 2.3, bicarbonate 29, BUN 20, serum creatinine 0.99.  WBC 17.3, hemoglobin 14.4, platelets 290K.  Chest x-ray showed large hiatal hernia and left basilar atelectasis.  EKG showed sinus rhythm without any acute ST wave changes.  The patient was admitted because of her severe hypokalemia and further work-up.  The patient's electrolytes were repleted.  She was started on IVF with clinical improvement.  She did not have any further dizzy or presyncopal spells during hospitalization.  Initial orthostatics were positive.  Repeat orthostatics improved on day of d/c.  PT was requested and did not recommend any follow up.  Her K and Na improved and pt clinically improved.    Assessment and Plan: * Hypokalemia Due to poor oral intake and HCTZ Hold HCTZ Repleted Check magnesium--2.0 Remain on telemetry--personally reviewed.  No concerning dysrhythmia Added potassium to IV fluids 3.8 on day of d/c Will not restart HCTZ  Generalized weakness Primarily due to volume depletion, hyponatremia, hypokalemia Obtain UA--neg for pyuria TSH--0.561 PT evaluation>>no followup B12--418 Check orthostatics-->initially positive 3/14--I personally performed orthostatics>>>136/76 HR 74 sitting;  136/89 HR 81 standing  Dyspnea on exertion Stable on room air Primarily due to recent URI/bronchitis and degree  of deconditioning Personally reviewed tele--no concerning dysrhythmia No oxygen desaturation with ambulation on RA  Leukemoid reaction May be partly attributed to recent steroid injection Check lactate--1.5 Check PCT <0.10 Obtain UA--no pyuria Chest x-ray without  infiltrates--personally reviewed  Hyponatremia Secondary to poor solute intake and volume depletion Start normal saline>>improved  Essential hypertension Holding diltiazem today as pt took her spouse's atenolol this am 3/13 Holding HCTZ Overall stable/improved Would recommend discontinuation of HCTZ due to volume depletion and electrolyte abnormalities Will not restart HCTZ at time of d/c>>follow up with PCP Restart diltiazem  GERD (gastroesophageal reflux disease) Continue pantoprazole    Procedures performed: none  Disposition: Home Diet recommendation:  Regular diet DISCHARGE MEDICATION: Allergies as of 12/15/2021   No Known Allergies      Medication List     STOP taking these medications    cefPROZIL 250 MG tablet Commonly known as: CEFZIL   hydrochlorothiazide 25 MG tablet Commonly known as: HYDRODIURIL       TAKE these medications    ALPRAZolam 0.25 MG tablet Commonly known as: XANAX Take 0.25 mg by mouth at bedtime as needed for anxiety.   diltiazem 120 MG 24 hr capsule Commonly known as: DILACOR XR Take 120 mg by mouth every evening.   ezetimibe 10 MG tablet Commonly known as: ZETIA Take 10 mg by mouth daily.   HYDROcodone-acetaminophen 5-325 MG tablet Commonly known as: NORCO/VICODIN Take 1 tablet by mouth every 4 (four) hours as needed for moderate pain.   meclizine 25 MG tablet Commonly known as: ANTIVERT Take 25 mg by mouth 3 (three) times daily as needed for dizziness.   ondansetron 4 MG tablet Commonly known as: ZOFRAN Take 1 tablet (4 mg total) by mouth every 8 (eight) hours as needed for nausea or vomiting.   pantoprazole 40 MG tablet Commonly known as: PROTONIX TAKE (1) TABLET TWICE DAILY. What changed: Another medication with the same name was removed. Continue taking this medication, and follow the directions you see here.        Discharge Exam: Filed Weights   12/14/21 1100 12/14/21 1612  Weight: 75.3 kg 76.1 kg    HEENT:  [place]/AT, No thrush, no icterus CV:  RRR, no rub, no S3, no S4 Lung:  CTA, no wheeze, no rhonchi Abd:  soft/+BS, NT Ext:  Nonpitting LE edema, no lymphangitis, no synovitis, no rash Neuro:  CN II-XII intact, strength 4/5 in RUE, RLE, strength 4/5 LUE, LLE; sensation intact bilateral; no dysmetria; babinski equivocal    Condition at discharge: stable  The results of significant diagnostics from this hospitalization (including imaging, microbiology, ancillary and laboratory) are listed below for reference.   Imaging Studies: DG Chest Port 1 View  Result Date: 12/14/2021 CLINICAL DATA:  Dizziness.  Shortness of breath. EXAM: PORTABLE CHEST 1 VIEW COMPARISON:  02/07/2015 chest radiograph and 02/26/2016 upper GI. FINDINGS: Midline trachea. Moderate cardiomegaly. Large hiatal hernia again identified. This may be slightly increased. No pleural effusion or pneumothorax. The right lung is clear. The left lung base is poorly evaluated secondary to the overlying hiatal hernia. There is likely volume loss in the adjacent left lung base. Left apex and upper lung are clear. IMPRESSION: Large hiatal hernia, possibly increased compared to 02/07/2015. This degrades evaluation of the left lung base, which has probable volume loss and atelectasis. Cardiomegaly without congestive failure. Electronically Signed   By: Jeronimo Greaves M.D.   On: 12/14/2021 11:49    Microbiology: Results for orders placed or performed during the hospital  encounter of 12/14/21  Resp Panel by RT-PCR (Flu A&B, Covid) Nasopharyngeal Swab     Status: None   Collection Time: 12/14/21 11:34 AM   Specimen: Nasopharyngeal Swab; Nasopharyngeal(NP) swabs in vial transport medium  Result Value Ref Range Status   SARS Coronavirus 2 by RT PCR NEGATIVE NEGATIVE Final    Comment: (NOTE) SARS-CoV-2 target nucleic acids are NOT DETECTED.  The SARS-CoV-2 RNA is generally detectable in upper respiratory specimens during the acute phase of  infection. The lowest concentration of SARS-CoV-2 viral copies this assay can detect is 138 copies/mL. A negative result does not preclude SARS-Cov-2 infection and should not be used as the sole basis for treatment or other patient management decisions. A negative result may occur with  improper specimen collection/handling, submission of specimen other than nasopharyngeal swab, presence of viral mutation(s) within the areas targeted by this assay, and inadequate number of viral copies(<138 copies/mL). A negative result must be combined with clinical observations, patient history, and epidemiological information. The expected result is Negative.  Fact Sheet for Patients:  BloggerCourse.com  Fact Sheet for Healthcare Providers:  SeriousBroker.it  This test is no t yet approved or cleared by the [place] FDA and  has been authorized for detection and/or diagnosis of SARS-CoV-2 by FDA under an Emergency Use Authorization (EUA). This EUA will remain  in effect (meaning this test can be used) for the duration of the COVID-19 declaration under Section 564(b)(1) of the Act, 21 U.S.C.section 360bbb-3(b)(1), unless the authorization is terminated  or revoked sooner.       Influenza A by PCR NEGATIVE NEGATIVE Final   Influenza B by PCR NEGATIVE NEGATIVE Final    Comment: (NOTE) The Xpert Xpress SARS-CoV-2/FLU/RSV plus assay is intended as an aid in the diagnosis of influenza from Nasopharyngeal swab specimens and should not be used as a sole basis for treatment. Nasal washings and aspirates are unacceptable for Xpert Xpress SARS-CoV-2/FLU/RSV testing.  Fact Sheet for Patients: BloggerCourse.com  Fact Sheet for Healthcare Providers: SeriousBroker.it  This test is not yet approved or cleared by the [place] FDA and has been authorized for detection and/or diagnosis of SARS-CoV-2  by FDA under an Emergency Use Authorization (EUA). This EUA will remain in effect (meaning this test can be used) for the duration of the COVID-19 declaration under Section 564(b)(1) of the Act, 21 U.S.C. section 360bbb-3(b)(1), unless the authorization is terminated or revoked.  Performed at [facility], [address] [place], [place] [address]     Labs: CBC: Recent Labs  Lab 12/14/21 1110  WBC 17.3*  NEUTROABS 10.5*  HGB 14.4  HCT 43.1  MCV 83.9  PLT 290   Basic Metabolic Panel: Recent Labs  Lab 12/14/21 1110 12/15/21 0454  NA 130* 134*  K 2.3* 3.8  CL 88* 98  CO2 29 27  GLUCOSE 177* 113*  BUN 20 19  CREATININE 0.99 0.84  CALCIUM 9.4 8.6*  MG  --  2.0   Liver Function Tests: Recent Labs  Lab 12/14/21 1110  AST 16  ALT 16  ALKPHOS 76  BILITOT 0.9  PROT 7.2  ALBUMIN 3.8   CBG: No results for input(s): GLUCAP in the last 168 hours.  Discharge time spent: greater than 30 minutes.  Signed: Catarina Hartshorn, MD [facility] 12/15/2021

## 2021-12-16 LAB — URINE CULTURE

## 2022-08-16 ENCOUNTER — Encounter (INDEPENDENT_AMBULATORY_CARE_PROVIDER_SITE_OTHER): Payer: Self-pay | Admitting: Gastroenterology

## 2022-12-30 IMAGING — DX DG CHEST 1V PORT
1 series · 1 of 1 positions shown · non-contrast
Comparison: 02/07/2015 chest radiograph and 02/26/2016 upper GI.

CLINICAL DATA: Dizziness.  Shortness of breath.

EXAM:
PORTABLE CHEST 1 VIEW

[chest ap]
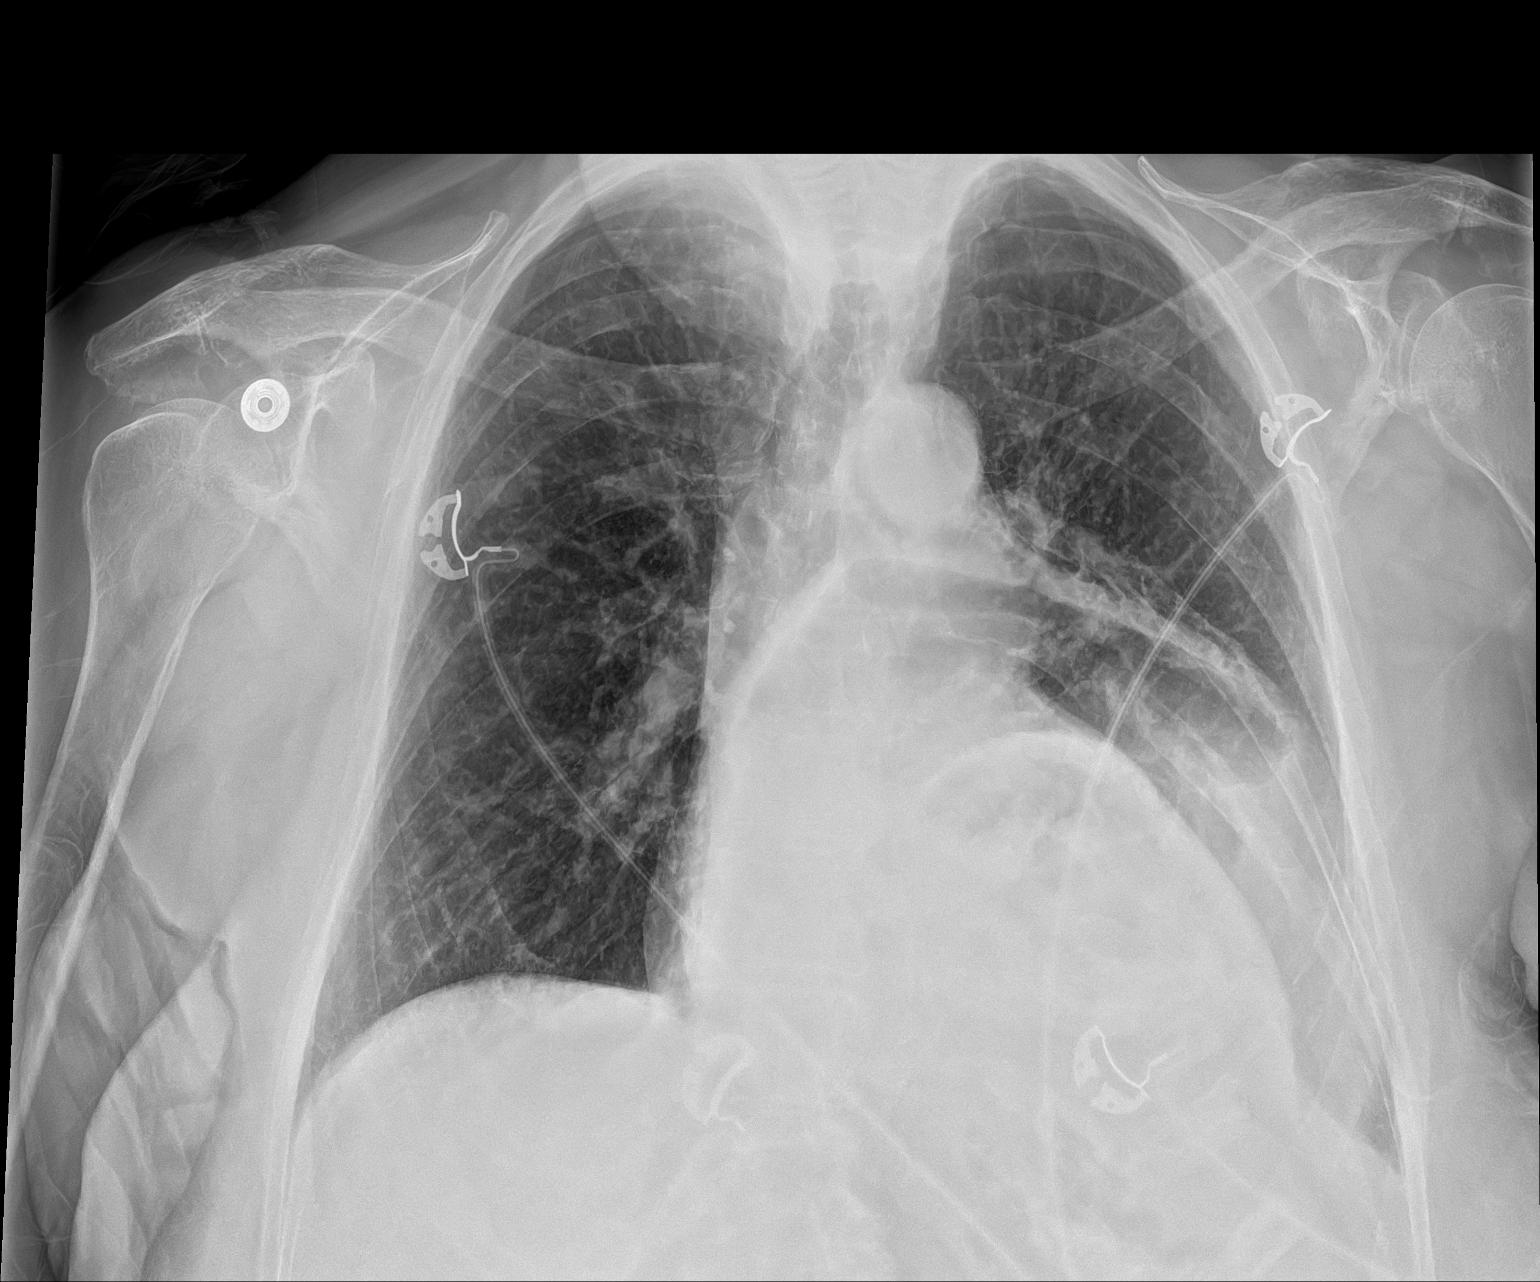

[1 of 1 positions shown; findings below may reference images not displayed]

FINDINGS: Midline trachea. Moderate cardiomegaly. Large hiatal hernia again
identified. This may be slightly increased. No pleural effusion or
pneumothorax. The right lung is clear. The left lung base is poorly
evaluated secondary to the overlying hiatal hernia. There is likely
volume loss in the adjacent left lung base. Left apex and upper lung
are clear.
IMPRESSION: Large hiatal hernia, possibly increased compared to 02/07/2015. This
degrades evaluation of the left lung base, which has probable volume
loss and atelectasis.

Cardiomegaly without congestive failure.

## 2023-08-08 ENCOUNTER — Inpatient Hospital Stay (HOSPITAL_COMMUNITY)
Admission: EM | Admit: 2023-08-08 | Discharge: 2023-08-11 | DRG: 563 | Disposition: A | Discharge status: Skilled Nursing Facility | Payer: PPO | Attending: Family Medicine | Admitting: Family Medicine

## 2023-08-08 ENCOUNTER — Emergency Department (HOSPITAL_COMMUNITY): Payer: PPO

## 2023-08-08 ENCOUNTER — Other Ambulatory Visit: Payer: Self-pay

## 2023-08-08 ENCOUNTER — Encounter (HOSPITAL_COMMUNITY): Payer: Self-pay | Admitting: *Deleted

## 2023-08-08 DIAGNOSIS — I10 Essential (primary) hypertension: Secondary | ICD-10-CM | POA: Diagnosis not present

## 2023-08-08 DIAGNOSIS — W19XXXA Unspecified fall, initial encounter: Secondary | ICD-10-CM

## 2023-08-08 DIAGNOSIS — Z8249 Family history of ischemic heart disease and other diseases of the circulatory system: Secondary | ICD-10-CM

## 2023-08-08 DIAGNOSIS — D72829 Elevated white blood cell count, unspecified: Secondary | ICD-10-CM | POA: Diagnosis not present

## 2023-08-08 DIAGNOSIS — E785 Hyperlipidemia, unspecified: Secondary | ICD-10-CM | POA: Diagnosis present

## 2023-08-08 DIAGNOSIS — E876 Hypokalemia: Secondary | ICD-10-CM

## 2023-08-08 DIAGNOSIS — R110 Nausea: Secondary | ICD-10-CM | POA: Diagnosis present

## 2023-08-08 DIAGNOSIS — K219 Gastro-esophageal reflux disease without esophagitis: Secondary | ICD-10-CM

## 2023-08-08 DIAGNOSIS — S42342A Displaced spiral fracture of shaft of humerus, left arm, initial encounter for closed fracture: Secondary | ICD-10-CM | POA: Diagnosis not present

## 2023-08-08 DIAGNOSIS — I951 Orthostatic hypotension: Secondary | ICD-10-CM | POA: Diagnosis present

## 2023-08-08 DIAGNOSIS — S42292A Other displaced fracture of upper end of left humerus, initial encounter for closed fracture: Principal | ICD-10-CM

## 2023-08-08 DIAGNOSIS — E669 Obesity, unspecified: Secondary | ICD-10-CM | POA: Diagnosis present

## 2023-08-08 DIAGNOSIS — F419 Anxiety disorder, unspecified: Secondary | ICD-10-CM | POA: Diagnosis not present

## 2023-08-08 DIAGNOSIS — Z90710 Acquired absence of both cervix and uterus: Secondary | ICD-10-CM

## 2023-08-08 DIAGNOSIS — Z9049 Acquired absence of other specified parts of digestive tract: Secondary | ICD-10-CM

## 2023-08-08 DIAGNOSIS — S42332A Displaced oblique fracture of shaft of humerus, left arm, initial encounter for closed fracture: Secondary | ICD-10-CM

## 2023-08-08 DIAGNOSIS — S42202A Unspecified fracture of upper end of left humerus, initial encounter for closed fracture: Secondary | ICD-10-CM

## 2023-08-08 DIAGNOSIS — Z800 Family history of malignant neoplasm of digestive organs: Secondary | ICD-10-CM

## 2023-08-08 DIAGNOSIS — T502X5A Adverse effect of carbonic-anhydrase inhibitors, benzothiadiazides and other diuretics, initial encounter: Secondary | ICD-10-CM | POA: Diagnosis present

## 2023-08-08 DIAGNOSIS — Z6832 Body mass index (BMI) 32.0-32.9, adult: Secondary | ICD-10-CM

## 2023-08-08 DIAGNOSIS — S42309A Unspecified fracture of shaft of humerus, unspecified arm, initial encounter for closed fracture: Secondary | ICD-10-CM | POA: Insufficient documentation

## 2023-08-08 DIAGNOSIS — R42 Dizziness and giddiness: Secondary | ICD-10-CM

## 2023-08-08 DIAGNOSIS — W010XXA Fall on same level from slipping, tripping and stumbling without subsequent striking against object, initial encounter: Secondary | ICD-10-CM | POA: Diagnosis present

## 2023-08-08 DIAGNOSIS — G629 Polyneuropathy, unspecified: Secondary | ICD-10-CM | POA: Diagnosis present

## 2023-08-08 DIAGNOSIS — Y92009 Unspecified place in unspecified non-institutional (private) residence as the place of occurrence of the external cause: Secondary | ICD-10-CM

## 2023-08-08 DIAGNOSIS — Z79899 Other long term (current) drug therapy: Secondary | ICD-10-CM

## 2023-08-08 LAB — CBC
HCT: 38.9 % (ref 36.0–46.0)
Hemoglobin: 12.7 g/dL (ref 12.0–15.0)
MCH: 28.2 pg (ref 26.0–34.0)
MCHC: 32.6 g/dL (ref 30.0–36.0)
MCV: 86.4 fL (ref 80.0–100.0)
Platelets: 237 10*3/uL (ref 150–400)
RBC: 4.5 MIL/uL (ref 3.87–5.11)
RDW: 14.1 % (ref 11.5–15.5)
WBC: 12.3 10*3/uL — ABNORMAL HIGH (ref 4.0–10.5)
nRBC: 0 % (ref 0.0–0.2)

## 2023-08-08 LAB — BASIC METABOLIC PANEL
Anion gap: 10 (ref 5–15)
BUN: 19 mg/dL (ref 8–23)
CO2: 29 mmol/L (ref 22–32)
Calcium: 9.3 mg/dL (ref 8.9–10.3)
Chloride: 97 mmol/L — ABNORMAL LOW (ref 98–111)
Creatinine, Ser: 1.03 mg/dL — ABNORMAL HIGH (ref 0.44–1.00)
GFR, Estimated: 54 mL/min — ABNORMAL LOW (ref >60)
Glucose, Bld: 121 mg/dL — ABNORMAL HIGH (ref 70–99)
Potassium: 2.7 mmol/L — CL (ref 3.5–5.1)
Sodium: 136 mmol/L (ref 135–145)

## 2023-08-08 LAB — MAGNESIUM: Magnesium: 1.9 mg/dL (ref 1.7–2.4)

## 2023-08-08 LAB — PROCALCITONIN: Procalcitonin: 0.15 ng/mL

## 2023-08-08 MED ORDER — OXYCODONE HCL 5 MG PO TABS
5.0000 mg | ORAL_TABLET | ORAL | Status: DC | PRN
  Administered 2023-08-08 – 2023-08-09 (×3): 5 mg via ORAL
  Filled 2023-08-08 (×3): qty 1

## 2023-08-08 MED ORDER — ONDANSETRON HCL 4 MG PO TABS: 4.0000 mg | ORAL_TABLET | Freq: Four times a day (QID) | ORAL | Status: DC | PRN

## 2023-08-08 MED ORDER — MORPHINE SULFATE (PF) 2 MG/ML IV SOLN: 2.0000 mg | INTRAVENOUS | Status: DC | PRN

## 2023-08-08 MED ORDER — LACTATED RINGERS IV BOLUS
1000.0000 mL | Freq: Once | INTRAVENOUS | Status: AC
  Administered 2023-08-08: 1000 mL via INTRAVENOUS

## 2023-08-08 MED ORDER — ACETAMINOPHEN 325 MG PO TABS: 650.0000 mg | ORAL_TABLET | Freq: Four times a day (QID) | ORAL | Status: DC | PRN

## 2023-08-08 MED ORDER — EZETIMIBE 10 MG PO TABS
10.0000 mg | ORAL_TABLET | Freq: Every day | ORAL | Status: DC
  Administered 2023-08-08 – 2023-08-11 (×4): 10 mg via ORAL
  Filled 2023-08-08 (×4): qty 1

## 2023-08-08 MED ORDER — ACETAMINOPHEN 650 MG RE SUPP: 650.0000 mg | Freq: Four times a day (QID) | RECTAL | Status: DC | PRN

## 2023-08-08 MED ORDER — DILTIAZEM HCL ER 120 MG PO CP24
120.0000 mg | ORAL_CAPSULE | Freq: Every evening | ORAL | Status: DC
  Filled 2023-08-08 (×3): qty 1

## 2023-08-08 MED ORDER — ONDANSETRON 8 MG PO TBDP: 8.0000 mg | ORAL_TABLET | Freq: Three times a day (TID) | ORAL | 0 refills | Status: DC | PRN

## 2023-08-08 MED ORDER — POTASSIUM CHLORIDE CRYS ER 20 MEQ PO TBCR
40.0000 meq | EXTENDED_RELEASE_TABLET | Freq: Once | ORAL | Status: AC
Start: 2023-08-08 — End: 2023-08-08
  Administered 2023-08-08: 40 meq via ORAL
  Filled 2023-08-08: qty 2

## 2023-08-08 MED ORDER — PANTOPRAZOLE SODIUM 40 MG PO TBEC
40.0000 mg | DELAYED_RELEASE_TABLET | Freq: Every day | ORAL | Status: DC
  Administered 2023-08-08 – 2023-08-11 (×4): 40 mg via ORAL
  Filled 2023-08-08 (×4): qty 1

## 2023-08-08 MED ORDER — POTASSIUM CHLORIDE 10 MEQ/100ML IV SOLN
10.0000 meq | INTRAVENOUS | Status: AC
  Administered 2023-08-08 (×2): 10 meq via INTRAVENOUS
  Filled 2023-08-08 (×2): qty 100

## 2023-08-08 MED ORDER — MECLIZINE HCL 12.5 MG PO TABS
25.0000 mg | ORAL_TABLET | Freq: Three times a day (TID) | ORAL | Status: DC | PRN
  Administered 2023-08-09: 25 mg via ORAL
  Filled 2023-08-08: qty 2

## 2023-08-08 MED ORDER — SODIUM CHLORIDE 0.9 % IV BOLUS: 500.0000 mL | Freq: Once | INTRAVENOUS | Status: DC

## 2023-08-08 MED ORDER — POTASSIUM CHLORIDE CRYS ER 20 MEQ PO TBCR: EXTENDED_RELEASE_TABLET | ORAL | 0 refills | Status: DC

## 2023-08-08 MED ORDER — OXYCODONE-ACETAMINOPHEN 5-325 MG PO TABS: 1.0000 | ORAL_TABLET | Freq: Four times a day (QID) | ORAL | 0 refills | Status: DC | PRN

## 2023-08-08 MED ORDER — HEPARIN SODIUM (PORCINE) 5000 UNIT/ML IJ SOLN
5000.0000 [IU] | Freq: Three times a day (TID) | INTRAMUSCULAR | Status: DC
  Administered 2023-08-08 – 2023-08-11 (×8): 5000 [IU] via SUBCUTANEOUS
  Filled 2023-08-08 (×8): qty 1

## 2023-08-08 MED ORDER — ONDANSETRON HCL 4 MG/2ML IJ SOLN
4.0000 mg | Freq: Once | INTRAMUSCULAR | Status: AC
  Administered 2023-08-08: 4 mg via INTRAVENOUS
  Filled 2023-08-08: qty 2

## 2023-08-08 MED ORDER — POTASSIUM CHLORIDE IN NACL 40-0.9 MEQ/L-% IV SOLN: INTRAVENOUS | Status: AC

## 2023-08-08 MED ORDER — POTASSIUM CHLORIDE 2 MEQ/ML IV SOLN
INTRAVENOUS | Status: DC
  Filled 2023-08-08 (×2): qty 1000

## 2023-08-08 MED ORDER — MORPHINE SULFATE (PF) 4 MG/ML IV SOLN
4.0000 mg | Freq: Once | INTRAVENOUS | Status: AC
  Administered 2023-08-08: 4 mg via INTRAVENOUS
  Filled 2023-08-08: qty 1

## 2023-08-08 MED ORDER — ONDANSETRON HCL 4 MG/2ML IJ SOLN: 4.0000 mg | Freq: Four times a day (QID) | INTRAMUSCULAR | Status: DC | PRN

## 2023-08-08 MED ORDER — ALPRAZOLAM 0.25 MG PO TABS: 0.2500 mg | ORAL_TABLET | Freq: Every evening | ORAL | Status: DC | PRN

## 2023-08-08 NOTE — Assessment & Plan Note (Signed)
-  Holding hydrochlorothiazide for now due to hypokalemia -BP stable 118/63 - 146/77

## 2023-08-08 NOTE — Assessment & Plan Note (Signed)
Continue PPI ?

## 2023-08-08 NOTE — H&P (Signed)
History and Physical    Patient: Sara [name] ZOX:[id number] DOB: 10/04/1941 DOA: 08/08/2023 DOS: the patient was seen and examined on 08/08/2023 PCP: Royann Shivers, PA-C  Patient coming from: Home  Chief Complaint:  Chief Complaint  Patient presents with   Fall   HPI: Sara [name] is a 82 y.o. female with medical history significant of GERD, hypertension, anxiety, hyperlipidemia, vertigo, and more presents the ED with a chief complaint of fall.  Patient reports that she fell like the room was spinning when she woke up.  She thought it was her normal vertigo and allergies.  It was intermittent in the day.  When she was walking from the kitchen to her bedroom she came around a corner in the dining room and became so dizzy that she fell down.  She landed on her left shoulder.  She reports the room was spinning.  She did not pass out.  She did not hit her head.  She did not have any nausea, vomiting, flushing, diaphoresis at that time.  Patient reports she did just feel generally weak.  Patient reports that she did not take her meclizine when she thought that she had her normal vertigo.  She denies any other infectious symptoms or fever.  Patient reports that she is taking a potassium supplement at home.  She is not sure the dosage of it.  She has been taking it since she had hypokalemia secondary to hydrochlorothiazide in the past.  She is still on her hydrochlorothiazide for peripheral edema and blood pressure.  She reports that she has had a normal appetite.  No diarrhea.  She does not report that the symptoms felt like when she had hypokalemia in the past.  In the ED when they tried to stand patient up to try to ambulate her, she became nauseous but did not vomit.  ED provider believes this was related to pain medication.  Patient has family members that work at [employer].  They have reviewed her x-rays and would like to speak to Ortho in person.  Ortho was consulted from the ED and determined  patient to not need surgery, but certified RN family member at bedside would like to discuss that assessment in further detail.  Patient does not smoke and does not drink.  Patient is full code but would not want to remain on life support long-term. Review of Systems: As mentioned in the history of present illness. All other systems reviewed and are negative. Past Medical History:  Diagnosis Date   Anemia    IDA   GERD (gastroesophageal reflux disease)    Hernia of unspecified site of abdominal cavity without mention of obstruction or gangrene    Hypertension    Macular degeneration    Past Surgical History:  Procedure Laterality Date   ABDOMINAL HYSTERECTOMY     CHOLECYSTECTOMY     COLONOSCOPY     ESOPHAGOGASTRODUODENOSCOPY  12/21/2011   Procedure: ESOPHAGOGASTRODUODENOSCOPY (EGD);  Surgeon: Malissa Hippo, MD;  Location: [facility];  Service: Endoscopy;  Laterality: N/A;  730   ORIF ANKLE FRACTURE     right ankle   UPPER GASTROINTESTINAL ENDOSCOPY     Social History:  reports that she has never smoked. She has never used smokeless tobacco. She reports that she does not currently use alcohol. She reports that she does not use drugs.  No Known Allergies  Family History  Problem Relation Age of Onset   Heart disease Mother    Aneurysm Father  Pancreatic cancer Brother    Healthy Daughter    Healthy Daughter    Healthy Daughter    Healthy Son    Healthy Son    Anesthesia problems Neg Hx    Hypotension Neg Hx    Malignant hyperthermia Neg Hx    Pseudochol deficiency Neg Hx     Prior to Admission medications   Medication Sig Start Date End Date Taking? Authorizing Provider  ALPRAZolam Prudy Feeler) 0.25 MG tablet Take 0.25 mg by mouth at bedtime as needed for anxiety.   Yes [provider]  diltiazem (DILACOR XR) 120 MG 24 hr capsule Take 120 mg by mouth every evening.    Yes [provider]  HYDROcodone-acetaminophen (NORCO) 5-325 MG per tablet Take 1  tablet by mouth every 4 (four) hours as needed for moderate pain. 02/21/12  Yes [provider]  ondansetron (ZOFRAN-ODT) 8 MG disintegrating tablet Take 1 tablet (8 mg total) by mouth every 8 (eight) hours as needed for nausea or vomiting. 08/08/23  Yes Cathren Laine, MD  oxyCODONE-acetaminophen (PERCOCET/ROXICET) 5-325 MG tablet Take 1 tablet by mouth every 6 (six) hours as needed for severe pain (pain score 7-10). 08/08/23  Yes Cathren Laine, MD  potassium chloride SA (KLOR-CON M) 20 MEQ tablet One po bid x 4 days, then one po once a day 08/08/23  Yes Cathren Laine, MD  ezetimibe (ZETIA) 10 MG tablet Take 10 mg by mouth daily. 11/14/21   [provider]  meclizine (ANTIVERT) 25 MG tablet Take 25 mg by mouth 3 (three) times daily as needed for dizziness.    [provider]  ondansetron (ZOFRAN) 4 MG tablet Take 1 tablet (4 mg total) by mouth every 8 (eight) hours as needed for nausea or vomiting. 07/14/15   Setzer, Brand Males, NP  pantoprazole (PROTONIX) 40 MG tablet TAKE (1) TABLET TWICE DAILY. 06/24/15   Len Blalock, NP    Physical Exam: Vitals:   08/08/23 1400 08/08/23 1600 08/08/23 1627 08/08/23 1706  BP: 119/71 122/88 123/75 (!) 125/98  Pulse: 86  91 96  Resp: 14 15 17    Temp:   98 F (36.7 C) 98.6 F (37 C)  TempSrc:   Oral   SpO2: 94%  92% 95%  Weight:      Height:       1.  General: Patient lying supine in bed,  no acute distress   2. Psychiatric: Alert and oriented x 3, mood and behavior normal for situation, pleasant and cooperative with exam   3. Neurologic: Speech and language are normal, face is symmetric, moves all 4 extremities voluntarily, at baseline without acute deficits on limited exam   4. HEENMT:  Head is atraumatic, normocephalic, pupils reactive to light, neck is supple, trachea is midline, mucous membranes are moist   5. Respiratory : Lungs are clear to auscultation bilaterally without wheezing, rhonchi, rales, no cyanosis, no  increase in work of breathing or accessory muscle use   6. Cardiovascular : Heart rate normal, rhythm is regular, no murmurs, rubs or gallops, trace peripheral edema, peripheral pulses palpated   7. Gastrointestinal:  Abdomen is soft, nondistended, nontender to palpation bowel sounds active, no masses or organomegaly palpated   8. Skin:  Skin is warm, dry and intact without rashes, acute lesions, or ulcers on limited exam   9.Musculoskeletal:  Slight deformity at left shoulder when compared to right, not tender to light palpation, currently in shoulder immobilizer  Data Reviewed: In the ED Patient was  afebrile, heart rate slightly tachycardic at 91, respiratory rate normal, blood pressure well-controlled, satting 94-96% Leukocytosis at 12.3, hemoglobin 12.7 Hypokalemia at 2.7 Left shoulder imaging shows a comminuted fracture of the left humeral head with lateral displacement of dominant fracture fragment and an oblique fracture of the proximal humeral diaphysis with mild apex lateral angulation EKG shows a heart rate of 89, sinus rhythm, QTc 447 Ortho was consulted and determined the patient was safe to be discharged home, however patient was not able to ambulate in the ED so admission was requested Ortho does not believe this to require surgical repair  Assessment and Plan: * Fall -related to dizziness -no LOC -Did not hit head -Humerus fractures - likely non surgical -Ortho to see in the AM -pain control -shoulder immobilizer  Leukocytosis -WBC 12.3 -Likely acute phase reactant  -procal pending -UA pending  Anxiety -Continue xanax  Humerus fracture -Comminuted fracture of left humeral head with lateral displacement of the dominant fracture fragments -oblique fracture of proximal humeral diaphysis with mild apex lateral angulation and 1 cortical shaft with medial displacement  Hypokalemia -K+ 2.7 - given in ED -continue LR +KCL tonight -2/2  hydrochlorothiazide -Reports compliance with K+ supplement at home  Essential hypertension -Holding hydrochlorothiazide for now due to hypokalemia -BP stable 118/63 - 146/77  GERD (gastroesophageal reflux disease) -Continue PPI      Advance Care Planning:   Code Status: Full Code  Consults: Ortho to see in a.m.  Family Communication: Husband, daughter, granddaughter at bedside  Severity of Illness: The appropriate patient status for this patient is OBSERVATION. Observation status is judged to be reasonable and necessary in order to provide the required intensity of service to ensure the patient's safety. The patient's presenting symptoms, physical exam findings, and initial radiographic and laboratory data in the context of their medical condition is felt to place them at decreased risk for further clinical deterioration. Furthermore, it is anticipated that the patient will be medically stable for discharge from the hospital within 2 midnights of admission.   Author: Lilyan Gilford, DO 08/08/2023 8:01 PM  For on call review [email or web address].

## 2023-08-08 NOTE — ED Triage Notes (Signed)
Pt BIB [facility] for c/o fall; pt states she was walking from her kitchen back to bed and had a dizzy spell and fell landing on her left shoulder; pt states initially when she fell her arm went numb but since then she has feeling to the arm  Pt states she has a hx of vertigo

## 2023-08-08 NOTE — ED Notes (Signed)
Ambulated patient and she was unable to stand for very long due to feeling nauseas, dizzy, and not feeling able to take steps, so assisted her with a nurse tech put her back into bed.

## 2023-08-08 NOTE — ED Provider Notes (Addendum)
[place] EMERGENCY DEPARTMENT AT [facility] Provider Note   CSN: [id number] Arrival date & time: 08/08/23  4010     History  Chief Complaint  Patient presents with   Sara [name] is a 82 y.o. female.  Pt s/p fall at home today. Indicates hx vertigo/dizziness, indicates was coming around a corner and lost balance and fell onto left shoulder. C/o left shoulder pain, constant, dull, non radiating. Denies syncope or loc. Denies head injury, contusion or headache. No anticoagulant use. No palpitations. No chest pain. No sob. No abd pain or nv. No rectal bleeding or melena. No dysuria or gu c/o. No recent change in meds or new meds (although did take a hydrocodone post shoulder injury this AM). Other than left shoulder, pt denies any other pain or injury. Has been ambulatory since.   The history is provided by the patient, the EMS personnel and medical records.  Fall Pertinent negatives include no chest pain, no abdominal pain, no headaches and no shortness of breath.       Home Medications Prior to Admission medications   Medication Sig Start Date End Date Taking? Authorizing Provider  ALPRAZolam Prudy Feeler) 0.25 MG tablet Take 0.25 mg by mouth at bedtime as needed for anxiety.   Yes [provider]  diltiazem (DILACOR XR) 120 MG 24 hr capsule Take 120 mg by mouth every evening.    Yes [provider]  HYDROcodone-acetaminophen (NORCO) 5-325 MG per tablet Take 1 tablet by mouth every 4 (four) hours as needed for moderate pain. 02/21/12  Yes [provider]  ondansetron (ZOFRAN-ODT) 8 MG disintegrating tablet Take 1 tablet (8 mg total) by mouth every 8 (eight) hours as needed for nausea or vomiting. 08/08/23  Yes Cathren Laine, MD  oxyCODONE-acetaminophen (PERCOCET/ROXICET) 5-325 MG tablet Take 1 tablet by mouth every 6 (six) hours as needed for severe pain (pain score 7-10). 08/08/23  Yes Cathren Laine, MD  potassium chloride SA (KLOR-CON M) 20  MEQ tablet One po bid x 4 days, then one po once a day 08/08/23  Yes Cathren Laine, MD  ezetimibe (ZETIA) 10 MG tablet Take 10 mg by mouth daily. 11/14/21   [provider]  meclizine (ANTIVERT) 25 MG tablet Take 25 mg by mouth 3 (three) times daily as needed for dizziness.    [provider]  ondansetron (ZOFRAN) 4 MG tablet Take 1 tablet (4 mg total) by mouth every 8 (eight) hours as needed for nausea or vomiting. 07/14/15   Setzer, Brand Males, NP  pantoprazole (PROTONIX) 40 MG tablet TAKE (1) TABLET TWICE DAILY. 06/24/15   Setzer, Brand Males, NP      Allergies    Patient has no known allergies.    Review of Systems   Review of Systems  Constitutional:  Negative for fever.  HENT:  Negative for ear pain, hearing loss and tinnitus.   Eyes:  Negative for visual disturbance.  Respiratory:  Negative for shortness of breath.   Cardiovascular:  Negative for chest pain and palpitations.  Gastrointestinal:  Negative for abdominal pain, blood in stool, nausea and vomiting.  Genitourinary:  Negative for dysuria and flank pain.  Musculoskeletal:  Negative for back pain and neck pain.  Skin:  Negative for wound.  Neurological:  Negative for weakness, numbness and headaches.  Hematological:  Does not bruise/bleed easily.  Psychiatric/Behavioral:  Negative for confusion.     Physical Exam Updated Vital Signs BP 118/63 (BP Location: Right Arm)  Pulse 91   Temp 98.1 F (36.7 C) (Oral)   Resp 12   Ht 1.575 m (5\' 2" )   Wt 80.7 kg   SpO2 94%   BMI 32.56 kg/m  Physical Exam Vitals and nursing note reviewed.  Constitutional:      Appearance: Normal appearance. She is well-developed.  HENT:     Head: Atraumatic.     Nose: Nose normal.     Mouth/Throat:     Mouth: Mucous membranes are moist.  Eyes:     General: No scleral icterus.    Conjunctiva/sclera: Conjunctivae normal.     Pupils: Pupils are equal, round, and reactive to light.  Neck:     Vascular: No carotid bruit.      Trachea: No tracheal deviation.  Cardiovascular:     Rate and Rhythm: Normal rate and regular rhythm.     Pulses: Normal pulses.     Heart sounds: Normal heart sounds. No murmur heard.    No friction rub. No gallop.  Pulmonary:     Effort: Pulmonary effort is normal. No respiratory distress.     Breath sounds: Normal breath sounds.  Chest:     Chest wall: No tenderness.  Abdominal:     General: Bowel sounds are normal. There is no distension.     Palpations: Abdomen is soft.     Tenderness: There is no abdominal tenderness.  Musculoskeletal:     Cervical back: Normal range of motion and neck supple. No rigidity or tenderness. No muscular tenderness.     Comments: Swelling and tenderness left shoulder, otherwise good rom bil extremities without pain or other focal bony tenderness.   Skin:    General: Skin is warm and dry.     Findings: No rash.  Neurological:     Mental Status: She is alert.     Comments: Alert, speech normal. GCS 15. Motor/sens grossly intact bil. LUE NVI. Steady gait.   Psychiatric:        Mood and Affect: Mood normal.    ED Results / Procedures / Treatments   Labs (all labs ordered are listed, but only abnormal results are displayed) Results for orders placed or performed during the hospital encounter of 08/08/23  Basic metabolic panel  Result Value Ref Range   Sodium 136 135 - 145 mmol/L   Potassium 2.7 (LL) 3.5 - 5.1 mmol/L   Chloride 97 (L) 98 - 111 mmol/L   CO2 29 22 - 32 mmol/L   Glucose, Bld 121 (H) 70 - 99 mg/dL   BUN 19 8 - 23 mg/dL   Creatinine, Ser 1.61 (H) 0.44 - 1.00 mg/dL   Calcium 9.3 8.9 - 09.6 mg/dL   GFR, Estimated 54 (L) >60 mL/min   Anion gap 10 5 - 15  CBC  Result Value Ref Range   WBC 12.3 (H) 4.0 - 10.5 K/uL   RBC 4.50 3.87 - 5.11 MIL/uL   Hemoglobin 12.7 12.0 - 15.0 g/dL   HCT 04.5 40.9 - 81.1 %   MCV 86.4 80.0 - 100.0 fL   MCH 28.2 26.0 - 34.0 pg   MCHC 32.6 30.0 - 36.0 g/dL   RDW 91.4 78.2 - 95.6 %   Platelets 237 150 -  400 K/uL   nRBC 0.0 0.0 - 0.2 %  Magnesium  Result Value Ref Range   Magnesium 1.9 1.7 - 2.4 mg/dL   DG Shoulder Left  Result Date: 08/08/2023 CLINICAL DATA:  Status post fall onto left shoulder EXAM:  LEFT SHOULDER - 3 VIEW COMPARISON:  None Available. FINDINGS: Comminuted fracture of the left humeral head with lateral displacement of the dominant fracture fragments. Oblique fracture of the proximal humeral diaphysis with mild apex lateral angulation and 1 cortical shaft width medial displacement. No acute dislocation. IMPRESSION: 1. Comminuted fracture of the left humeral head with lateral displacement of the dominant fracture fragments. 2. Oblique fracture of the proximal humeral diaphysis with mild apex lateral angulation and 1 cortical shaft width medial displacement. Electronically Signed   By: Agustin Cree M.D.   On: 08/08/2023 08:22     EKG EKG Interpretation Date/Time:  Monday August 08 2023 13:21:59 EST Ventricular Rate:  89 PR Interval:  175 QRS Duration:  97 QT Interval:  367 QTC Calculation: 447 R Axis:   -23  Text Interpretation: Sinus rhythm Atrial premature complex Nonspecific ST abnormality Confirmed by Cathren Laine ([id number]) on 08/08/2023 1:26:47 PM  Radiology DG Shoulder Left  Result Date: 08/08/2023 CLINICAL DATA:  Status post fall onto left shoulder EXAM: LEFT SHOULDER - 3 VIEW COMPARISON:  None Available. FINDINGS: Comminuted fracture of the left humeral head with lateral displacement of the dominant fracture fragments. Oblique fracture of the proximal humeral diaphysis with mild apex lateral angulation and 1 cortical shaft width medial displacement. No acute dislocation. IMPRESSION: 1. Comminuted fracture of the left humeral head with lateral displacement of the dominant fracture fragments. 2. Oblique fracture of the proximal humeral diaphysis with mild apex lateral angulation and 1 cortical shaft width medial displacement. Electronically Signed   By: Agustin Cree M.D.   On:  08/08/2023 08:22    Procedures Procedures    Medications Ordered in ED Medications  potassium chloride 10 mEq in 100 mL IVPB (10 mEq Intravenous New Bag/Given 08/08/23 1333)  morphine (PF) 4 MG/ML injection 4 mg (4 mg Intravenous Given 08/08/23 0859)  ondansetron (ZOFRAN) injection 4 mg (4 mg Intravenous Given 08/08/23 0900)  potassium chloride SA (KLOR-CON M) CR tablet 40 mEq (40 mEq Oral Given 08/08/23 1214)    ED Course/ Medical Decision Making/ A&P                                 Medical Decision Making Problems Addressed: Accidental fall, initial encounter: acute illness or injury with systemic symptoms that poses a threat to life or bodily functions Closed displaced oblique fracture of shaft of left humerus, initial encounter: acute illness or injury with systemic symptoms that poses a threat to life or bodily functions Dizziness: acute illness or injury    Details: Acute/recurrent Fracture of humeral head, closed, left, initial encounter: acute illness or injury with systemic symptoms that poses a threat to life or bodily functions Hypokalemia: acute illness or injury with systemic symptoms that poses a threat to life or bodily functions  Amount and/or Complexity of Data Reviewed Independent Historian: EMS    Details: hx External Data Reviewed: notes. Labs: ordered. Decision-making details documented in ED Course. Radiology: ordered and independent interpretation performed. Decision-making details documented in ED Course. ECG/medicine tests: ordered and independent interpretation performed. Decision-making details documented in ED Course. Discussion of management or test interpretation with external provider(s): orthopedics  Risk Prescription drug management. Decision regarding hospitalization.   Iv ns. Continuous pulse ox and cardiac monitoring. Labs ordered/sent. Imaging ordered.   Differential diagnosis includes prox humerus fracture, dehydration, anemia, etc. Dispo  decision including potential need for admission considered - will get labs and imaging  and reassess.   Reviewed nursing notes and prior charts for additional history. External reports reviewed. Additional history from: family, ems.  Cardiac monitor: sinus rhythm, rate 88.  Morphine iv. Zofran iv.   Labs reviewed/interpreted by me - wbc 12, hct 39, chem normal x k low. Kcl iv and po. Mg added. Mg normal.   Xrays reviewed/interpreted by me - +humerus shaft fracture and humeral head fx. Orthopedics consulted. Discussed pt with Dr Dallas Schimke who reviewed imaging - he indicates sling/immobilizer and f/u in office this coming week.   Pain improved.  Po fluids/food.   Pt weak, nauseated when trying to get up, feels faint.   Given dizziness with fall, humerus and humeral head fx, significant hypokalemia, will admit.   Hospitalists consulted for admission.   CRITICAL CARE RE: severe hypokalemia with parenteral k+ replacement, weakness/dizziness, fall with humerus fx.  Performed by: Suzi Roots Total critical care time: 40 minutes Critical care time was exclusive of separately billable procedures and treating other patients. Critical care was necessary to treat or prevent imminent or life-threatening deterioration. Critical care was time spent personally by me on the following activities: development of treatment plan with patient and/or surrogate as well as nursing, discussions with consultants, evaluation of patient's response to treatment, examination of patient, obtaining history from patient or surrogate, ordering and performing treatments and interventions, ordering and review of laboratory studies, ordering and review of radiographic studies, pulse oximetry and re-evaluation of patient's condition.          Final Clinical Impression(s) / ED Diagnoses Final diagnoses:  Fracture of humeral head, closed, left, initial encounter  Closed displaced oblique fracture of shaft of left  humerus, initial encounter  Accidental fall, initial encounter  Hypokalemia    Rx / DC Orders ED Discharge Orders          Ordered    oxyCODONE-acetaminophen (PERCOCET/ROXICET) 5-325 MG tablet  Every 6 hours PRN        08/08/23 1309    ondansetron (ZOFRAN-ODT) 8 MG disintegrating tablet  Every 8 hours PRN        08/08/23 1309    potassium chloride SA (KLOR-CON M) 20 MEQ tablet        08/08/23 1309                  Cathren Laine, MD 08/08/23 1350    Cathren Laine, MD 08/08/23 [phone]

## 2023-08-08 NOTE — Assessment & Plan Note (Signed)
-  Comminuted fracture of left humeral head with lateral displacement of the dominant fracture fragments -oblique fracture of proximal humeral diaphysis with mild apex lateral angulation and 1 cortical shaft with medial displacement

## 2023-08-08 NOTE — ED Notes (Addendum)
ED TO INPATIENT HANDOFF REPORT  ED Nurse Name and Phone #: Lawerance Sabal Name/Age/Gender Sara [name] 82 y.o. female Room/Bed: APA01/APA01  Code Status   Code Status: Full Code  Home/SNF/Other Home Patient oriented to: self, place, time, and situation Is this baseline? Yes   Triage Complete: Triage complete  Chief Complaint Fall [W19.XXXA]  Triage Note Pt BIB [facility] for c/o fall; pt states she was walking from her kitchen back to bed and had a dizzy spell and fell landing on her left shoulder; pt states initially when she fell her arm went numb but since then she has feeling to the arm  Pt states she has a hx of vertigo   Allergies No Known Allergies  Level of Care/Admitting Diagnosis ED Disposition     ED Disposition  Admit   Condition  --   Comment  Hospital Area: [facility] [[id number]]  Level of Care: Telemetry [5]  Covid Evaluation: Asymptomatic - no recent exposure (last 10 days) testing not required  Diagnosis: Fall [290176]  Admitting Physician: Lilyan Gilford [[id number]]  Attending Physician: Lilyan Gilford [[id number]]          B Medical/Surgery History Past Medical History:  Diagnosis Date   Anemia    IDA   GERD (gastroesophageal reflux disease)    Hernia of unspecified site of abdominal cavity without mention of obstruction or gangrene    Hypertension    Macular degeneration    Past Surgical History:  Procedure Laterality Date   ABDOMINAL HYSTERECTOMY     CHOLECYSTECTOMY     COLONOSCOPY     ESOPHAGOGASTRODUODENOSCOPY  12/21/2011   Procedure: ESOPHAGOGASTRODUODENOSCOPY (EGD);  Surgeon: Malissa Hippo, MD;  Location: [facility];  Service: Endoscopy;  Laterality: N/A;  730   ORIF ANKLE FRACTURE     right ankle   UPPER GASTROINTESTINAL ENDOSCOPY       A IV Location/Drains/Wounds Patient Lines/Drains/Airways Status     Active Line/Drains/Airways     Name Placement date Placement time Site Days   Peripheral IV  08/08/23 20 G 1" Right Antecubital 08/08/23  0853  Antecubital  less than 1            Intake/Output Last 24 hours  Intake/Output Summary (Last 24 hours) at 08/08/2023 1613 Last data filed at 08/08/2023 1443 Gross per 24 hour  Intake 197.56 ml  Output --  Net 197.56 ml    Labs/Imaging Results for orders placed or performed during the hospital encounter of 08/08/23 (from the past 48 hour(s))  Basic metabolic panel     Status: Abnormal   Collection Time: 08/08/23 10:34 AM  Result Value Ref Range   Sodium 136 135 - 145 mmol/L   Potassium 2.7 (LL) 3.5 - 5.1 mmol/L    Comment: CRITICAL RESULT CALLED TO, READ BACK BY AND VERIFIED WITH TALBOT,T AT 11:30AM ON 08/08/23 BY FESTERMAN,C   Chloride 97 (L) 98 - 111 mmol/L   CO2 29 22 - 32 mmol/L   Glucose, Bld 121 (H) 70 - 99 mg/dL    Comment: Glucose reference range applies only to samples taken after fasting for at least 8 hours.   BUN 19 8 - 23 mg/dL   Creatinine, Ser 2.84 (H) 0.44 - 1.00 mg/dL   Calcium 9.3 8.9 - 13.2 mg/dL   GFR, Estimated 54 (L) >60 mL/min    Comment: (NOTE) Calculated using the CKD-EPI Creatinine Equation (2021)    Anion gap 10 5 - 15  Comment: Performed at [facility], [address] [place], [place] [address]  CBC     Status: Abnormal   Collection Time: 08/08/23 10:34 AM  Result Value Ref Range   WBC 12.3 (H) 4.0 - 10.5 K/uL   RBC 4.50 3.87 - 5.11 MIL/uL   Hemoglobin 12.7 12.0 - 15.0 g/dL   HCT 60.4 54.0 - 98.1 %   MCV 86.4 80.0 - 100.0 fL   MCH 28.2 26.0 - 34.0 pg   MCHC 32.6 30.0 - 36.0 g/dL   RDW 19.1 47.8 - 29.5 %   Platelets 237 150 - 400 K/uL   nRBC 0.0 0.0 - 0.2 %    Comment: Performed at [facility], [address] [place], [place] [address]  Magnesium     Status: None   Collection Time: 08/08/23 10:34 AM  Result Value Ref Range   Magnesium 1.9 1.7 - 2.4 mg/dL    Comment: Performed at [facility], [address] [place], [place] [address]   DG Shoulder Left  Result Date:  08/08/2023 CLINICAL DATA:  Status post fall onto left shoulder EXAM: LEFT SHOULDER - 3 VIEW COMPARISON:  None Available. FINDINGS: Comminuted fracture of the left humeral head with lateral displacement of the dominant fracture fragments. Oblique fracture of the proximal humeral diaphysis with mild apex lateral angulation and 1 cortical shaft width medial displacement. No acute dislocation. IMPRESSION: 1. Comminuted fracture of the left humeral head with lateral displacement of the dominant fracture fragments. 2. Oblique fracture of the proximal humeral diaphysis with mild apex lateral angulation and 1 cortical shaft width medial displacement. Electronically Signed   By: Agustin Cree M.D.   On: 08/08/2023 08:22    Pending Labs Unresulted Labs (From admission, onward)     Start     Ordered   08/08/23 1549  Urinalysis, Routine w reflex microscopic -Urine, Clean Catch  ONCE - STAT,   URGENT       Question:  Specimen Source  Answer:  Urine, Clean Catch   08/08/23 1548   Signed and Held  Comprehensive metabolic panel  Tomorrow morning,   R        Signed and Held   Signed and Held  Magnesium  Tomorrow morning,   R        Signed and Held   Signed and Held  CBC with Differential/Platelet  Tomorrow morning,   R        Signed and Held            Vitals/Pain Today's Vitals   08/08/23 1300 08/08/23 1334 08/08/23 1400 08/08/23 1600  BP: 121/83  119/71 122/88  Pulse: 100  86   Resp: 19  14 15   Temp:      TempSrc:      SpO2: 93%  94%   Weight:      Height:      PainSc:  6       Isolation Precautions No active isolations  Medications Medications  lactated ringers bolus 1,000 mL (has no administration in time range)  morphine (PF) 4 MG/ML injection 4 mg (4 mg Intravenous Given 08/08/23 0859)  ondansetron (ZOFRAN) injection 4 mg (4 mg Intravenous Given 08/08/23 0900)  potassium chloride 10 mEq in 100 mL IVPB (0 mEq Intravenous Stopped 08/08/23 1443)  potassium chloride SA (KLOR-CON M) CR tablet  40 mEq (40 mEq Oral Given 08/08/23 1214)    Mobility walks with device        R Recommendations: See Admitting  Provider Note  Report given to: Val

## 2023-08-08 NOTE — Assessment & Plan Note (Signed)
-  related to dizziness -no LOC -Did not hit head -Humerus fractures - likely non surgical -Ortho to see in the AM -pain control -shoulder immobilizer

## 2023-08-08 NOTE — Discharge Instructions (Addendum)
It was our pleasure to provide your ER care today - we hope that you feel better.  Wear shoulder sling/immobilizer. Icepack to sore area as need.  Fall precautions.   Take motrin or aleve as need for pain. You may also take percocet as need for pain. No driving for the next 6 hours or when taking percocet. Also, do not take tylenol or acetaminophen containing medication when taking percocet.  If pain medication makes you nauseated, you may take zofran as need for nausea.  Follow up with orthopedist in the coming week.   From today's labs, your potassium level is low  - eat plenty of fruits and vegetables, take potassium supplement as prescribed, and follow up with primary care doctor in one week for recheck.   Return to ER if worse, new symptoms, new/severe pain, severe headache, chest pain, trouble breathing, fainting, numbness/weakness, or other concern.

## 2023-08-08 NOTE — Assessment & Plan Note (Signed)
-  K+ 2.7 - given in ED -continue LR +KCL tonight -2/2 hydrochlorothiazide -Reports compliance with K+ supplement at home

## 2023-08-08 NOTE — Assessment & Plan Note (Signed)
Continue xanax 

## 2023-08-08 NOTE — Assessment & Plan Note (Signed)
-  WBC 12.3 -Likely acute phase reactant  -procal pending -UA pending

## 2023-08-09 ENCOUNTER — Observation Stay (HOSPITAL_COMMUNITY): Payer: PPO

## 2023-08-09 DIAGNOSIS — S42292A Other displaced fracture of upper end of left humerus, initial encounter for closed fracture: Secondary | ICD-10-CM | POA: Diagnosis not present

## 2023-08-09 DIAGNOSIS — W19XXXA Unspecified fall, initial encounter: Secondary | ICD-10-CM | POA: Diagnosis not present

## 2023-08-09 DIAGNOSIS — K219 Gastro-esophageal reflux disease without esophagitis: Secondary | ICD-10-CM | POA: Diagnosis not present

## 2023-08-09 DIAGNOSIS — S42345A Nondisplaced spiral fracture of shaft of humerus, left arm, initial encounter for closed fracture: Secondary | ICD-10-CM | POA: Diagnosis not present

## 2023-08-09 DIAGNOSIS — D72829 Elevated white blood cell count, unspecified: Secondary | ICD-10-CM | POA: Diagnosis not present

## 2023-08-09 LAB — CBC WITH DIFFERENTIAL/PLATELET
Abs Immature Granulocytes: 0.03 10*3/uL (ref 0.00–0.07)
Basophils Absolute: 0 10*3/uL (ref 0.0–0.1)
Basophils Relative: 0 %
Eosinophils Absolute: 0.1 10*3/uL (ref 0.0–0.5)
Eosinophils Relative: 1 %
HCT: 31.5 % — ABNORMAL LOW (ref 36.0–46.0)
Hemoglobin: 10.2 g/dL — ABNORMAL LOW (ref 12.0–15.0)
Immature Granulocytes: 0 %
Lymphocytes Relative: 26 %
Lymphs Abs: 2.5 10*3/uL (ref 0.7–4.0)
MCH: 28.2 pg (ref 26.0–34.0)
MCHC: 32.4 g/dL (ref 30.0–36.0)
MCV: 87 fL (ref 80.0–100.0)
Monocytes Absolute: 1 10*3/uL (ref 0.1–1.0)
Monocytes Relative: 11 %
Neutro Abs: 5.7 10*3/uL (ref 1.7–7.7)
Neutrophils Relative %: 62 %
Platelets: 200 10*3/uL (ref 150–400)
RBC: 3.62 MIL/uL — ABNORMAL LOW (ref 3.87–5.11)
RDW: 14.4 % (ref 11.5–15.5)
WBC: 9.5 10*3/uL (ref 4.0–10.5)
nRBC: 0 % (ref 0.0–0.2)

## 2023-08-09 LAB — COMPREHENSIVE METABOLIC PANEL
ALT: 15 U/L (ref 0–44)
AST: 12 U/L — ABNORMAL LOW (ref 15–41)
Albumin: 2.9 g/dL — ABNORMAL LOW (ref 3.5–5.0)
Alkaline Phosphatase: 58 U/L (ref 38–126)
Anion gap: 8 (ref 5–15)
BUN: 15 mg/dL (ref 8–23)
CO2: 26 mmol/L (ref 22–32)
Calcium: 8.4 mg/dL — ABNORMAL LOW (ref 8.9–10.3)
Chloride: 101 mmol/L (ref 98–111)
Creatinine, Ser: 0.84 mg/dL (ref 0.44–1.00)
GFR, Estimated: >60 mL/min (ref >60)
Glucose, Bld: 109 mg/dL — ABNORMAL HIGH (ref 70–99)
Potassium: 3.4 mmol/L — ABNORMAL LOW (ref 3.5–5.1)
Sodium: 135 mmol/L (ref 135–145)
Total Bilirubin: 0.6 mg/dL (ref <1.2)
Total Protein: 5.7 g/dL — ABNORMAL LOW (ref 6.5–8.1)

## 2023-08-09 LAB — MAGNESIUM: Magnesium: 1.8 mg/dL (ref 1.7–2.4)

## 2023-08-09 MED ORDER — ACETAMINOPHEN 500 MG PO TABS
1000.0000 mg | ORAL_TABLET | Freq: Four times a day (QID) | ORAL | Status: DC
  Administered 2023-08-09 – 2023-08-11 (×8): 1000 mg via ORAL
  Filled 2023-08-09 (×8): qty 2

## 2023-08-09 MED ORDER — OXYCODONE HCL 5 MG PO TABS: 5.0000 mg | ORAL_TABLET | ORAL | Status: DC | PRN

## 2023-08-09 MED ORDER — IBUPROFEN 400 MG PO TABS
800.0000 mg | ORAL_TABLET | Freq: Four times a day (QID) | ORAL | Status: DC
  Administered 2023-08-09 – 2023-08-11 (×7): 800 mg via ORAL
  Filled 2023-08-09 (×7): qty 2

## 2023-08-09 NOTE — Evaluation (Signed)
Physical Therapy Evaluation Patient Details Name: JOSE ALLEYNE MRN: [id number] DOB: 08/24/1941 Today's Date: 08/09/2023  History of Present Illness  ASHELYN MCCRAVY is a 82 y.o. female with medical history significant of GERD, hypertension, anxiety, hyperlipidemia, vertigo, and more presents the ED with a chief complaint of fall.  Patient reports that she fell like the room was spinning when she woke up.  She thought it was her normal vertigo and allergies.  It was intermittent in the day.  When she was walking from the kitchen to her bedroom she came around a corner in the dining room and became so dizzy that she fell down.  She landed on her left shoulder.  She reports the room was spinning.  She did not pass out.  She did not hit her head.  She did not have any nausea, vomiting, flushing, diaphoresis at that time.  Patient reports she did just feel generally weak.  Patient reports that she did not take her meclizine when she thought that she had her normal vertigo.  She denies any other infectious symptoms or fever.  Patient reports that she is taking a potassium supplement at home.  She is not sure the dosage of it.  She has been taking it since she had hypokalemia secondary to hydrochlorothiazide in the past.  She is still on her hydrochlorothiazide for peripheral edema and blood pressure.  She reports that she has had a normal appetite.  No diarrhea.  She does not report that the symptoms felt like when she had hypokalemia in the past.  In the ED when they tried to stand patient up to try to ambulate her, she became nauseous but did not vomit.  ED provider believes this was related to pain medication.  Patient has family members that work at [employer].  They have reviewed her x-rays and would like to speak to Ortho in person.  Ortho was consulted from the ED and determined patient to not need surgery, but certified RN family member at bedside would like to discuss that assessment in further detail.    Clinical Impression  Patient demonstrates slow labored movement for sitting up at bedside requiring Mod assist for scooting to EOB, unable to stand without AD due to BLE weakness, required use of quad-cane with right hand due to LUE in sling and limited to a few slow labored side steps before having to sit due to c/o fatigue, dizziness and generalized weakness.  Patient tolerated sitting up in chair after therapy with family members present.  Patient will benefit from continued skilled physical therapy in hospital and recommended venue below to increase strength, balance, endurance for safe ADLs and gait.           If plan is discharge home, recommend the following: A lot of help with bathing/dressing/bathroom;A lot of help with walking and/or transfers;Help with stairs or ramp for entrance;Assistance with cooking/housework   Can travel by private vehicle   No    Equipment Recommendations Gilmer Mor (wide based quad cane)  Recommendations for Other Services       Functional Status Assessment Patient has had a recent decline in their functional status and demonstrates the ability to make significant improvements in function in a reasonable and predictable amount of time.     Precautions / Restrictions Precautions Precautions: Fall Restrictions Weight Bearing Restrictions: Yes LUE Weight Bearing: Non weight bearing      Mobility  Bed Mobility Overal bed mobility: Needs Assistance Bed Mobility: Supine to Sit  Supine to sit: Min assist, Mod assist     General bed mobility comments: increased time, labored movement with most diffiuclty scooting to EOB    Transfers Overall transfer level: Needs assistance Equipment used: Quad cane Transfers: Sit to/from Stand, Bed to chair/wheelchair/BSC Sit to Stand: Mod assist   Step pivot transfers: Mod assist       General transfer comment: unable to stand without AD due to BLE weakness, required use of quad cane using RUE, unable to  use LUE due to sling/hemural fracture    Ambulation/Gait Ambulation/Gait assistance: Mod assist, Max assist Gait Distance (Feet): 4 Feet Assistive device: Quad cane Gait Pattern/deviations: Decreased step length - right, Decreased step length - left, Decreased stride length, Trunk flexed Gait velocity: slow     General Gait Details: limited to a few slow labored unsteady steps at bedside with flexed trunk, unable to ambulate away from bedside due to weakness, fall risk  Stairs            Wheelchair Mobility     Tilt Bed    Modified Rankin (Stroke Patients Only)       Balance Overall balance assessment: Needs assistance Sitting-balance support: Feet supported, No upper extremity supported Sitting balance-Leahy Scale: Fair Sitting balance - Comments: fair/good seated at EOB   Standing balance support: During functional activity, Single extremity supported, Reliant on assistive device for balance Standing balance-Leahy Scale: Poor Standing balance comment: using wide based quad cane                             Pertinent Vitals/Pain Pain Assessment Pain Assessment: 0-10 Pain Score: 7  Pain Location: L UE Pain Descriptors / Indicators: Guarding, Grimacing Pain Intervention(s): Limited activity within patient's tolerance, Monitored during session, Premedicated before session, Repositioned    Home Living Family/patient expects to be discharged to:: Private residence Living Arrangements: Spouse/significant other Available Help at Discharge: Family;Available 24 hours/day Type of Home: Mobile home Home Access: Ramped entrance       Home Layout: One level Home Equipment: Rolling Walker (2 wheels);BSC/3in1;Cane - single point;Shower seat;Grab bars - tub/shower      Prior Function Prior Level of Function : Independent/Modified Independent             Mobility Comments: Tourist information centre manager without AD. ADLs Comments: Independent; drives      Extremity/Trunk Assessment   Upper Extremity Assessment Upper Extremity Assessment: Defer to OT evaluation    Lower Extremity Assessment Lower Extremity Assessment: Generalized weakness    Cervical / Trunk Assessment Cervical / Trunk Assessment: Normal  Communication   Communication Communication: No apparent difficulties  Cognition Arousal: Alert Behavior During Therapy: WFL for tasks assessed/performed Overall Cognitive Status: Within Functional Limits for tasks assessed                                          General Comments      Exercises     Assessment/Plan    PT Assessment Patient needs continued PT services  PT Problem List Decreased strength;Decreased activity tolerance;Decreased balance;Decreased mobility;Pain       PT Treatment Interventions DME instruction;Gait training;Stair training;Functional mobility training;Therapeutic activities;Therapeutic exercise;Balance training;Patient/family education    PT Goals (Current goals can be found in the Care Plan section)  Acute Rehab PT Goals Patient Stated Goal: return home after rehab PT Goal Formulation:  With patient/family Time For Goal Achievement: 08/23/23 Potential to Achieve Goals: Good    Frequency Min 3X/week     Co-evaluation PT/OT/SLP Co-Evaluation/Treatment: Yes Reason for Co-Treatment: To address functional/ADL transfers PT goals addressed during session: Mobility/safety with mobility;Balance;Proper use of DME OT goals addressed during session: ADL's and self-care       AM-PAC PT "6 Clicks" Mobility  Outcome Measure Help needed turning from your back to your side while in a flat bed without using bedrails?: A Lot Help needed moving from lying on your back to sitting on the side of a flat bed without using bedrails?: A Lot Help needed moving to and from a bed to a chair (including a wheelchair)?: A Lot Help needed standing up from a chair using your arms (e.g.,  wheelchair or bedside chair)?: A Lot Help needed to walk in hospital room?: A Lot Help needed climbing 3-5 steps with a railing? : Total 6 Click Score: 11    End of Session   Activity Tolerance: Patient tolerated treatment well;Patient limited by fatigue Patient left: in chair;with call bell/phone within reach;with family/visitor present Nurse Communication: Mobility status PT Visit Diagnosis: Unsteadiness on feet (R26.81);Other abnormalities of gait and mobility (R26.89);Muscle weakness (generalized) (M62.81)    Time: 0272-5366 PT Time Calculation (min) (ACUTE ONLY): 29 min   Charges:   PT Evaluation $PT Eval Moderate Complexity: 1 Mod PT Treatments $Therapeutic Activity: 23-37 mins PT General Charges $$ ACUTE PT VISIT: 1 Visit         11:58 AM, 08/09/23 Ocie Bob, MPT Physical Therapist with [facility] [phone] office [phone] mobile phone

## 2023-08-09 NOTE — Plan of Care (Signed)
  Problem: Acute Rehab PT Goals(only PT should resolve) Goal: Pt Will Go Supine/Side To Sit Outcome: Progressing Flowsheets (Taken 08/09/2023 1200) Pt will go Supine/Side to Sit:  with minimal assist  with contact guard assist Goal: Patient Will Transfer Sit To/From Stand Outcome: Progressing Flowsheets (Taken 08/09/2023 1200) Patient will transfer sit to/from stand:  with contact guard assist  with minimal assist Goal: Pt Will Transfer Bed To Chair/Chair To Bed Outcome: Progressing Flowsheets (Taken 08/09/2023 1200) Pt will Transfer Bed to Chair/Chair to Bed: with min assist Goal: Pt Will Ambulate Outcome: Progressing Flowsheets (Taken 08/09/2023 1200) Pt will Ambulate:  25 feet  with minimal assist  with moderate assist  with cane Note: Quad-cane   12:01 PM, 08/09/23 Ocie Bob, MPT Physical Therapist with [facility] 336 [phone] office [phone] mobile phone

## 2023-08-09 NOTE — Evaluation (Signed)
Occupational Therapy Evaluation Patient Details Name: Sara [name] MRN: [id number] DOB: 12/02/1940 Today's Date: 08/09/2023   History of Present Illness Sara [name] is a 82 y.o. female with medical history significant of GERD, hypertension, anxiety, hyperlipidemia, vertigo, and more presents the ED with a chief complaint of fall.  Patient reports that she fell like the room was spinning when she woke up.  She thought it was her normal vertigo and allergies.  It was intermittent in the day.  When she was walking from the kitchen to her bedroom she came around a corner in the dining room and became so dizzy that she fell down.  She landed on her left shoulder.  She reports the room was spinning.  She did not pass out.  She did not hit her head.  She did not have any nausea, vomiting, flushing, diaphoresis at that time.  Patient reports she did just feel generally weak.  Patient reports that she did not take her meclizine when she thought that she had her normal vertigo.  She denies any other infectious symptoms or fever.  Patient reports that she is taking a potassium supplement at home.  She is not sure the dosage of it.  She has been taking it since she had hypokalemia secondary to hydrochlorothiazide in the past.  She is still on her hydrochlorothiazide for peripheral edema and blood pressure.  She reports that she has had a normal appetite.  No diarrhea.  She does not report that the symptoms felt like when she had hypokalemia in the past.  In the ED when they tried to stand patient up to try to ambulate her, she became nauseous but did not vomit.  ED provider believes this was related to pain medication.  Patient has family members that work at [employer].  They have reviewed her x-rays and would like to speak to Ortho in person.  Ortho was consulted from the ED and determined patient to not need surgery, but certified RN family member at bedside would like to discuss that assessment in further detail. (per  DO)   Clinical Impression   Pt agreeable to OT and PT co-evaluation. Pt is independent at baseline but requiring mod A for transfers, mod to max A for ambulation, and mod to max A for ADL tasks. L UE immobilized in a sling. R UE is generally weak. Pt would require much assist if returning home at this time. Pt will benefit from continued OT in the hospital and recommended venue below to increase strength, balance, and endurance for safe ADL's.          If plan is discharge home, recommend the following: A lot of help with walking and/or transfers;A lot of help with bathing/dressing/bathroom;Assistance with cooking/housework;Assist for transportation;Help with stairs or ramp for entrance    Functional Status Assessment  Patient has had a recent decline in their functional status and demonstrates the ability to make significant improvements in function in a reasonable and predictable amount of time.  Equipment Recommendations  None recommended by OT           Precautions / Restrictions Precautions Precautions: Fall Restrictions Weight Bearing Restrictions: Yes LUE Weight Bearing: Non weight bearing      Mobility Bed Mobility Overal bed mobility: Needs Assistance Bed Mobility: Supine to Sit     Supine to sit: Min assist, Mod assist     General bed mobility comments: increased time, labored movement with most diffiuclty scooting to EOB  Transfers Overall transfer level: Needs assistance Equipment used: Quad cane Transfers: Sit to/from Stand, Bed to chair/wheelchair/BSC Sit to Stand: Mod assist     Step pivot transfers: Mod assist     General transfer comment: beak; in need of AD      Balance Overall balance assessment: Needs assistance Sitting-balance support: Feet supported, No upper extremity supported Sitting balance-Leahy Scale: Fair Sitting balance - Comments: fair/good seated at EOB   Standing balance support: During functional activity, Single  extremity supported, Reliant on assistive device for balance Standing balance-Leahy Scale: Poor Standing balance comment: using quad cane                           ADL either performed or assessed with clinical judgement   ADL Overall ADL's : Needs assistance/impaired     Grooming: Moderate assistance;Sitting   Upper Body Bathing: Moderate assistance;Sitting   Lower Body Bathing: Maximal assistance;Sitting/lateral leans   Upper Body Dressing : Moderate assistance;Sitting   Lower Body Dressing: Maximal assistance;Sitting/lateral leans   Toilet Transfer: Moderate assistance;Stand-pivot (quad cane) Toilet Transfer Details (indicate cue type and reason): simulated via EOB to chair Toileting- Clothing Manipulation and Hygiene: Maximal assistance;Moderate assistance;Sitting/lateral lean       Functional mobility during ADLs: Moderate assistance;Maximal assistance;Rolling walker (2 wheels)       Vision Baseline Vision/History: 1 Wears glasses Ability to See in Adequate Light: 1 Impaired Patient Visual Report: No change from baseline Vision Assessment?: No apparent visual deficits;Wears glasses for reading     Perception Perception: Not tested       Praxis Praxis: Not tested       Pertinent Vitals/Pain Pain Assessment Pain Assessment: 0-10 Pain Score: 7  Pain Location: L UE Pain Descriptors / Indicators: Guarding, Grimacing Pain Intervention(s): Limited activity within patient's tolerance, Monitored during session, Repositioned     Extremity/Trunk Assessment Upper Extremity Assessment Upper Extremity Assessment: Generalized weakness (R shoulder flexion 3-/5; generally weak otherwise. L UE in sling due to fracture.)   Lower Extremity Assessment Lower Extremity Assessment: Defer to PT evaluation   Cervical / Trunk Assessment Cervical / Trunk Assessment: Normal   Communication Communication Communication: No apparent difficulties   Cognition Arousal:  Alert Behavior During Therapy: WFL for tasks assessed/performed Overall Cognitive Status: Within Functional Limits for tasks assessed                                                        Home Living Family/patient expects to be discharged to:: Private residence Living Arrangements: Spouse/significant other Available Help at Discharge: Family;Available 24 hours/day Type of Home: Mobile home Home Access: Ramped entrance     Home Layout: One level     Bathroom Shower/Tub: Tub/shower unit;Walk-in shower   Bathroom Toilet: Handicapped height Bathroom Accessibility: Yes   Home Equipment: Agricultural consultant (2 wheels);BSC/3in1;Cane - single point;Shower seat;Grab bars - tub/shower          Prior Functioning/Environment Prior Level of Function : Independent/Modified Independent             Mobility Comments: Tourist information centre manager without AD. ADLs Comments: Independent; drives        OT Problem List: Decreased strength;Decreased range of motion;Decreased activity tolerance;Impaired balance (sitting and/or standing);Impaired UE functional use      OT Treatment/Interventions: Self-care/ADL training;Therapeutic  exercise;DME and/or AE instruction;Therapeutic activities;Patient/family education    OT Goals(Current goals can be found in the care plan section) Acute Rehab OT Goals Patient Stated Goal: return home OT Goal Formulation: With patient/family Time For Goal Achievement: 08/23/23 Potential to Achieve Goals: Good  OT Frequency: Min 2X/week    Co-evaluation PT/OT/SLP Co-Evaluation/Treatment: Yes Reason for Co-Treatment: To address functional/ADL transfers PT goals addressed during session: Mobility/safety with mobility;Balance;Proper use of DME OT goals addressed during session: ADL's and self-care      AM-PAC OT "6 Clicks" Daily Activity     Outcome Measure Help from another person eating meals?: A Little Help from another person taking care  of personal grooming?: A Little Help from another person toileting, which includes using toliet, bedpan, or urinal?: A Lot Help from another person bathing (including washing, rinsing, drying)?: A Lot Help from another person to put on and taking off regular upper body clothing?: A Little Help from another person to put on and taking off regular lower body clothing?: A Lot 6 Click Score: 15   End of Session Equipment Utilized During Treatment: Other (comment) (quad cane)  Activity Tolerance: Patient tolerated treatment well Patient left: in chair;with call bell/phone within reach;with family/visitor present  OT Visit Diagnosis: Unsteadiness on feet (R26.81);Other abnormalities of gait and mobility (R26.89);Muscle weakness (generalized) (M62.81);Dizziness and giddiness (R42)                Time: 0272-5366 OT Time Calculation (min): 20 min Charges:  OT General Charges $OT Visit: 1 Visit OT Evaluation $OT Eval Low Complexity: 1 Low  Brooksie Ellwanger OT, MOT   Danie Chandler 08/09/2023, 12:10 PM

## 2023-08-09 NOTE — Plan of Care (Signed)
  Problem: Acute Rehab OT Goals (only OT should resolve) Goal: Pt. Will Perform Grooming Flowsheets (Taken 08/09/2023 1212) Pt Will Perform Grooming:  with supervision  sitting Goal: Pt. Will Perform Upper Body Dressing Flowsheets (Taken 08/09/2023 1212) Pt Will Perform Upper Body Dressing:  with min assist  with supervision  sitting Goal: Pt. Will Perform Lower Body Dressing Flowsheets (Taken 08/09/2023 1212) Pt Will Perform Lower Body Dressing:  with min assist  sitting/lateral leans Goal: Pt. Will Transfer To Toilet Flowsheets (Taken 08/09/2023 1212) Pt Will Transfer to Toilet:  with modified independence  ambulating Goal: Pt. Will Perform Toileting-Clothing Manipulation Flowsheets (Taken 08/09/2023 1212) Pt Will Perform Toileting - Clothing Manipulation and hygiene:  with modified independence  sitting/lateral leans Goal: Pt/Caregiver Will Perform Home Exercise Program Flowsheets (Taken 08/09/2023 1212) Pt/caregiver will Perform Home Exercise Program:  Increased ROM  Increased strength  Right Upper extremity  Independently  Nylan Nevel OT, MOT

## 2023-08-09 NOTE — Progress Notes (Signed)
PROGRESS NOTE    Sara [name]  WJX:[id number] DOB: 1941/06/24 DOA: 08/08/2023 PCP: [name]   Brief Narrative:    Sara [name] is a 82 y.o. female with medical history significant of GERD, hypertension, anxiety, hyperlipidemia, vertigo, and more presents the ED with a chief complaint of fall.  Patient reports that she fell like the room was spinning when she woke up.  She thought it was her normal vertigo and allergies.  It was intermittent in the day.  When she was walking from the kitchen to her bedroom she came around a corner in the dining room and became so dizzy that she fell down.  She landed on her left shoulder.  She is noted to have left proximal humerus fracture that has been evaluated by orthopedics with plans to follow-up further outpatient and maintain in sling for now.  She likely would not be a candidate for any further intervention.  She was also noted to have some significant hypokalemia likely secondary to HCTZ use which may have contributed to some weakness or dizziness as well.  This is improving.  PT has assessed her and recommends SNF which patient and family are agreeable to.  Placement is currently pending.  Assessment & Plan:   Principal Problem:   Fall Active Problems:   Leukocytosis   GERD (gastroesophageal reflux disease)   Essential hypertension   Hypokalemia   Humerus fracture   Anxiety  Assessment and Plan:   Fall -related to dizziness -no LOC -Did not hit head -Humerus fractures - likely non surgical -Ortho to see in the AM -pain control with scheduled Tylenol and ibuprofen as well as ice packs and avoid use of narcotics unless absolutely needed given some side effects of GI distress -shoulder immobilizer  Humerus fracture secondary to above -Comminuted fracture of left humeral head with lateral displacement of the dominant fracture fragments -oblique fracture of proximal humeral diaphysis with mild apex lateral angulation  and 1 cortical shaft with medial displacement  Hypokalemia-improving -K+ 2.7 on admission - given in ED -continue LR +KCL tonight -2/2 hydrochlorothiazide -Reports compliance with K+ supplement at home -Recheck a.m. labs  Anxiety -Continue xanax  Essential hypertension -Holding hydrochlorothiazide for now due to hypokalemia -BP stable -Plan to hold further use of HCTZ on discharge  GERD (gastroesophageal reflux disease) -Continue PPI   Obesity -BMI 32.56   DVT prophylaxis:Heparin Code Status: Full Family Communication: Daughters at bedside 11/5 Disposition Plan:  Status is: Observation The patient will require care spanning > 2 midnights and should be moved to inpatient because: Need for IVF and SNF placement.   Consultants:  Orthopedics  Procedures:  None  Antimicrobials:  None   Subjective: Patient seen and evaluated today with no new acute complaints or concerns.  She reports a pain level of about 6/10 to her left shoulder.  She has had some mild nausea, but no vomiting likely related to pain medications she believes.  Multiple family members at bedside.  No acute concerns or events noted overnight.  Objective: Vitals:   08/08/23 2100 08/09/23 0106 08/09/23 0500 08/09/23 1237  BP: 110/75 111/70 107/73 129/79  Pulse: 83 82 81 82  Resp: 17     Temp: 98.8 F (37.1 C) 98.9 F (37.2 C) 98.8 F (37.1 C) 98.5 F (36.9 C)  TempSrc: Oral Oral Oral Oral  SpO2: 91% 90% 92% 93%  Weight:      Height:        Intake/Output Summary (Last 24  hours) at 08/09/2023 1417 Last data filed at 08/09/2023 1310 Gross per 24 hour  Intake 588.08 ml  Output 650 ml  Net -61.92 ml   Filed Weights   08/08/23 0755  Weight: 80.7 kg    Examination:  General exam: Appears calm and comfortable  Respiratory system: Clear to auscultation. Respiratory effort normal. Cardiovascular system: S1 & S2 heard, RRR.  Gastrointestinal system: Abdomen is soft Central nervous  system: Alert and awake Extremities: Left upper extremity in sling Skin: No significant lesions noted Psychiatry: Flat affect.    Data Reviewed: I have personally reviewed following labs and imaging studies  CBC: Recent Labs  Lab 08/08/23 1034 08/09/23 0406  WBC 12.3* 9.5  NEUTROABS  --  5.7  HGB 12.7 10.2*  HCT 38.9 31.5*  MCV 86.4 87.0  PLT 237 200   Basic Metabolic Panel: Recent Labs  Lab 08/08/23 1034 08/09/23 0406  NA 136 135  K 2.7* 3.4*  CL 97* 101  CO2 29 26  GLUCOSE 121* 109*  BUN 19 15  CREATININE 1.03* 0.84  CALCIUM 9.3 8.4*  MG 1.9 1.8   GFR: Estimated Creatinine Clearance: 49.9 mL/min (by C-G formula based on SCr of 0.84 mg/dL). Liver Function Tests: Recent Labs  Lab 08/09/23 0406  AST 12*  ALT 15  ALKPHOS 58  BILITOT 0.6  PROT 5.7*  ALBUMIN 2.9*   No results for input(s): "LIPASE", "AMYLASE" in the last 168 hours. No results for input(s): "AMMONIA" in the last 168 hours. Coagulation Profile: No results for input(s): "INR", "PROTIME" in the last 168 hours. Cardiac Enzymes: No results for input(s): "CKTOTAL", "CKMB", "CKMBINDEX", "TROPONINI" in the last 168 hours. BNP (last 3 results) No results for input(s): "PROBNP" in the last 8760 hours. HbA1C: No results for input(s): "HGBA1C" in the last 72 hours. CBG: No results for input(s): "GLUCAP" in the last 168 hours. Lipid Profile: No results for input(s): "CHOL", "HDL", "LDLCALC", "TRIG", "CHOLHDL", "LDLDIRECT" in the last 72 hours. Thyroid Function Tests: No results for input(s): "TSH", "T4TOTAL", "FREET4", "T3FREE", "THYROIDAB" in the last 72 hours. Anemia Panel: No results for input(s): "VITAMINB12", "FOLATE", "FERRITIN", "TIBC", "IRON", "RETICCTPCT" in the last 72 hours. Sepsis Labs: Recent Labs  Lab 08/08/23 1034  PROCALCITON 0.15    No results found for this or any previous visit (from the past 240 hour(s)).       Radiology Studies: DG Shoulder Left  Result Date:  08/08/2023 CLINICAL DATA:  Status post fall onto left shoulder EXAM: LEFT SHOULDER - 3 VIEW COMPARISON:  None Available. FINDINGS: Comminuted fracture of the left humeral head with lateral displacement of the dominant fracture fragments. Oblique fracture of the proximal humeral diaphysis with mild apex lateral angulation and 1 cortical shaft width medial displacement. No acute dislocation. IMPRESSION: 1. Comminuted fracture of the left humeral head with lateral displacement of the dominant fracture fragments. 2. Oblique fracture of the proximal humeral diaphysis with mild apex lateral angulation and 1 cortical shaft width medial displacement. Electronically Signed   By: Agustin Cree M.D.   On: 08/08/2023 08:22        Scheduled Meds:  acetaminophen  1,000 mg Oral Q6H   ezetimibe  10 mg Oral Daily   heparin  5,000 Units Subcutaneous Q8H   ibuprofen  800 mg Oral Q6H   pantoprazole  40 mg Oral Daily   Continuous Infusions:  0.9 % NaCl with KCl 40 mEq / L 75 mL/hr at 08/09/23 0600     LOS: 0 days  Time spent: 55 minutes    Dontrelle Mazon Hoover Brunette, DO [facility]  If 7PM-7AM, please contact night-coverage [email or web address] 08/09/2023, 2:17 PM

## 2023-08-09 NOTE — NC FL2 (Signed)
MEDICAID [id number] LEVEL OF CARE FORM     IDENTIFICATION  Patient Name: Sara [name] Birthdate: [birth year] Sex: female Admission Date (Current Location): 08/08/2023  [facility] and [place] Number:  [employer] and Address:  [facility],  [address] [place] [address]      Provider Number: [id number]  Attending Physician Name and Address:  Erick Blinks, DO  Relative Name and Phone Number:       Current Level of Care: Hospital Recommended Level of Care: Skilled Nursing Facility Prior Approval Number:    Date Approved/Denied:   PASRR Number: [id number]  Discharge Plan: SNF    Current Diagnoses: Patient Active Problem List   Diagnosis Date Noted   Fall 08/08/2023   Humerus fracture 08/08/2023   Anxiety 08/08/2023   Hypokalemia 12/14/2021   Hyponatremia 12/14/2021   Generalized weakness 12/14/2021   Dyspnea on exertion 12/14/2021   Leukocytosis 12/14/2021   Hiatal hernia with gastroesophageal reflux 11/20/2012   Postherpetic neuralgia 09/19/2012   GERD (gastroesophageal reflux disease) 12/16/2011   IDA (iron deficiency anemia) 12/16/2011   Essential hypertension 12/16/2011    Orientation RESPIRATION BLADDER Height & Weight     Self, Time, Situation, Place  Normal Continent Weight: 178 lb (80.7 kg) Height:  5\' 2"  (157.5 cm)  BEHAVIORAL SYMPTOMS/MOOD NEUROLOGICAL BOWEL NUTRITION STATUS      Continent Diet (see dc summary)  AMBULATORY STATUS COMMUNICATION OF NEEDS Skin   Extensive Assist Verbally Normal                       Personal Care Assistance Level of Assistance  Bathing, Feeding, Dressing Bathing Assistance: Limited assistance Feeding assistance: Limited assistance Dressing Assistance: Maximum assistance     Functional Limitations Info  Sight, Hearing, Speech Sight Info: Adequate Hearing Info: Adequate Speech Info: Adequate    SPECIAL CARE FACTORS FREQUENCY  PT (By licensed PT), OT (By licensed OT)      PT Frequency: 5x week OT Frequency: 5x week            Contractures Contractures Info: Not present    Additional Factors Info  Code Status, Allergies Code Status Info: Full Allergies Info: KNA           Current Medications (08/09/2023):  This is the current hospital active medication list Current Facility-Administered Medications  Medication Dose Route Frequency Provider Last Rate Last Admin   0.9 % NaCl with KCl 40 mEq / L  infusion   Intravenous Continuous Zierle-Ghosh, Asia B, DO 75 mL/hr at 08/09/23 0600 Infusion Verify at 08/09/23 0600   acetaminophen (TYLENOL) tablet 1,000 mg  1,000 mg Oral Q6H Shah, Pratik D, DO   1,000 mg at 08/09/23 0946   ALPRAZolam (XANAX) tablet 0.25 mg  0.25 mg Oral QHS PRN Zierle-Ghosh, Asia B, DO       ezetimibe (ZETIA) tablet 10 mg  10 mg Oral Daily Zierle-Ghosh, Asia B, DO   10 mg at 08/09/23 0839   heparin injection 5,000 Units  5,000 Units Subcutaneous Q8H Zierle-Ghosh, Asia B, DO   5,000 Units at 08/09/23 9629   ibuprofen (ADVIL) tablet 800 mg  800 mg Oral Q6H Shah, Pratik D, DO       meclizine (ANTIVERT) tablet 25 mg  25 mg Oral TID PRN Zierle-Ghosh, Asia B, DO   25 mg at 08/09/23 0844   morphine (PF) 2 MG/ML injection 2 mg  2 mg Intravenous Q2H PRN Zierle-Ghosh, Asia B, DO  ondansetron (ZOFRAN) tablet 4 mg  4 mg Oral Q6H PRN Zierle-Ghosh, Asia B, DO       Or   ondansetron (ZOFRAN) injection 4 mg  4 mg Intravenous Q6H PRN Zierle-Ghosh, Asia B, DO       oxyCODONE (Oxy IR/ROXICODONE) immediate release tablet 5 mg  5 mg Oral Q4H PRN Sherryll Burger, Pratik D, DO       pantoprazole (PROTONIX) EC tablet 40 mg  40 mg Oral Daily Zierle-Ghosh, Asia B, DO   40 mg at 08/09/23 1610     Discharge Medications: Please see discharge summary for a list of discharge medications.  Relevant Imaging Results:  Relevant Lab Results:   Additional Information SSN: [id number] [place]

## 2023-08-09 NOTE — Consult Note (Signed)
ORTHOPAEDIC CONSULTATION  REQUESTING PHYSICIAN: Maurilio Lovely D, DO  ASSESSMENT AND PLAN: 82 y.o. female with the following: Left proximal humerus fracture, with extension to the humeral shaft  Given the nature of the injury, I have discussed operative versus nonoperative management with the patient and her family.  I would like to obtain a CT scan for further delineation of the fracture.  Patient does not need to remain in the hospital for further discussions regarding surgery.  I am happy to follow-up with the patient in the office to discuss further treatment.  If patient is discharged, I would like to see the patient early next week, either November 12 or 13 for further discussion.  - Weight Bearing Status/Activity: Nonweightbearing left upper extremity, continue to use a sling at all times.  - Additional recommended labs/tests: CT scan left shoulder  -VTE Prophylaxis: Per the hospitalist.  No orthopedic contraindications.  - Pain control: As needed  - Follow-up plan: 1 week in the office  -Procedures: None  Chief Complaint: Left arm pain  HPI: Sara [name] is a 82 y.o. female with PMH as listed below, who was brought to the hospital after sustaining mechanical fall.  She is right-hand dominant.  She reports that she felt dizzy, and subsequently fell.  She did not pass out.  She landed on her left shoulder.  She had immediate pain.  In the emergency department, she was noted to have a fracture of the left proximal humerus, as well as low potassium.  It is believed that the potassium could have contributed to her dizziness, and subsequent fall.  She has no numbness or tingling.  She is in a sling.  She has pain with motion.  She is able to eat and drink.  Past Medical History:  Diagnosis Date   Anemia    IDA   GERD (gastroesophageal reflux disease)    Hernia of unspecified site of abdominal cavity without mention of obstruction or gangrene    Hypertension    Macular  degeneration    Past Surgical History:  Procedure Laterality Date   ABDOMINAL HYSTERECTOMY     CHOLECYSTECTOMY     COLONOSCOPY     ESOPHAGOGASTRODUODENOSCOPY  12/21/2011   Procedure: ESOPHAGOGASTRODUODENOSCOPY (EGD);  Surgeon: Malissa Hippo, MD;  Location: [facility];  Service: Endoscopy;  Laterality: N/A;  730   ORIF ANKLE FRACTURE     right ankle   UPPER GASTROINTESTINAL ENDOSCOPY     Social History   Socioeconomic History   Marital status: Married    Spouse name: Not on file   Number of children: Not on file   Years of education: Not on file   Highest education level: Not on file  Occupational History   Not on file  Tobacco Use   Smoking status: Never   Smokeless tobacco: Never  Substance and Sexual Activity   Alcohol use: Not Currently    Comment: Very Rare   Drug use: No   Sexual activity: Yes    Birth control/protection: Surgical  Other Topics Concern   Not on file  Social History Narrative   Not on file   Social Determinants of Health   Financial Resource Strain: Not on file  Food Insecurity: No Food Insecurity (08/08/2023)   Hunger Vital Sign    Worried About Running Out of Food in the Last Year: Never true    Ran Out of Food in the Last Year: Never true  Transportation Needs: No Transportation Needs (08/08/2023)  PRAPARE - Administrator, Civil Service (Medical): No    Lack of Transportation (Non-Medical): No  Physical Activity: Not on file  Stress: Not on file  Social Connections: Not on file   Family History  Problem Relation Age of Onset   Heart disease Mother    Aneurysm Father    Pancreatic cancer Brother    Healthy Daughter    Healthy Daughter    Healthy Daughter    Healthy Son    Healthy Son    Anesthesia problems Neg Hx    Hypotension Neg Hx    Malignant hyperthermia Neg Hx    Pseudochol deficiency Neg Hx    No Known Allergies Prior to Admission medications   Medication Sig Start Date End Date Taking? Authorizing  Provider  ALPRAZolam Prudy Feeler) 0.25 MG tablet Take 0.25 mg by mouth at bedtime as needed for anxiety.   Yes [provider]  diltiazem (DILACOR XR) 120 MG 24 hr capsule Take 120 mg by mouth every evening.    Yes [provider]  HYDROcodone-acetaminophen (NORCO) 5-325 MG per tablet Take 1 tablet by mouth every 4 (four) hours as needed for moderate pain. 02/21/12  Yes [provider]  ondansetron (ZOFRAN-ODT) 8 MG disintegrating tablet Take 1 tablet (8 mg total) by mouth every 8 (eight) hours as needed for nausea or vomiting. 08/08/23  Yes Cathren Laine, MD  oxyCODONE-acetaminophen (PERCOCET/ROXICET) 5-325 MG tablet Take 1 tablet by mouth every 6 (six) hours as needed for severe pain (pain score 7-10). 08/08/23  Yes Cathren Laine, MD  potassium chloride SA (KLOR-CON M) 20 MEQ tablet One po bid x 4 days, then one po once a day 08/08/23  Yes Cathren Laine, MD  ezetimibe (ZETIA) 10 MG tablet Take 10 mg by mouth daily. 11/14/21   [provider]  meclizine (ANTIVERT) 25 MG tablet Take 25 mg by mouth 3 (three) times daily as needed for dizziness.    [provider]  ondansetron (ZOFRAN) 4 MG tablet Take 1 tablet (4 mg total) by mouth every 8 (eight) hours as needed for nausea or vomiting. 07/14/15   Setzer, Brand Males, NP  pantoprazole (PROTONIX) 40 MG tablet TAKE (1) TABLET TWICE DAILY. 06/24/15   Len Blalock, NP   DG Shoulder Left  Result Date: 08/08/2023 CLINICAL DATA:  Status post fall onto left shoulder EXAM: LEFT SHOULDER - 3 VIEW COMPARISON:  None Available. FINDINGS: Comminuted fracture of the left humeral head with lateral displacement of the dominant fracture fragments. Oblique fracture of the proximal humeral diaphysis with mild apex lateral angulation and 1 cortical shaft width medial displacement. No acute dislocation. IMPRESSION: 1. Comminuted fracture of the left humeral head with lateral displacement of the dominant fracture fragments. 2. Oblique fracture  of the proximal humeral diaphysis with mild apex lateral angulation and 1 cortical shaft width medial displacement. Electronically Signed   By: Agustin Cree M.D.   On: 08/08/2023 08:22     Family History Reviewed and non-contributory, no pertinent history of problems with bleeding or anesthesia    Review of Systems No fevers or chills No numbness or tingling No chest pain No shortness of breath No bowel or bladder dysfunction No GI distress No headaches + nausea No vomiting    OBJECTIVE  Vitals:Patient Vitals for the past 8 hrs:  BP Temp Temp src Pulse SpO2  08/09/23 0500 107/73 98.8 F (37.1 C) Oral 81 92 %  08/09/23 0106 111/70 98.9 F (37.2 C) Oral 82  90 %   General: Alert, no acute distress Cardiovascular: Warm extremities noted Respiratory: No cyanosis, no use of accessory musculature GI: No organomegaly, abdomen is soft and non-tender Skin: No lesions in the area of chief complaint other than those listed below in MSK exam.  Neurologic: Sensation intact distally save for the below mentioned MSK exam Psychiatric: Patient is competent for consent with normal mood and affect Lymphatic: No swelling obvious and reported other than the area involved in the exam below Extremities   LUE: Left arm is in a sling.  Intact sensation in the axillary nerve distribution.  Mild swelling is appreciated.  No bruising.  Fingers warm well-perfused.  She is able to wiggle all of her fingers.  Sensation intact throughout the left hand.  2+ radial pulse.   Test Results Imaging  X-ray of the left shoulder demonstrates a comminuted proximal humerus fracture, with involvement of the humeral head.  Potential humeral head split.  A greater tuberosity is fractured, and displaced.  There appears to be some valgus impaction.  There is a separate fracture line through the humerus shaft.   Labs cbc Recent Labs    08/08/23 1034 08/09/23 0406  WBC 12.3* 9.5  HGB 12.7 10.2*  HCT 38.9 31.5*   PLT 237 200     Recent Labs    08/08/23 1034 08/09/23 0406  NA 136 135  K 2.7* 3.4*  CL 97* 101  CO2 29 26  GLUCOSE 121* 109*  BUN 19 15  CREATININE 1.03* 0.84  CALCIUM 9.3 8.4*

## 2023-08-09 NOTE — Care Management Obs Status (Signed)
MEDICARE OBSERVATION STATUS NOTIFICATION   Patient Details  Name: Sara [name] MRN: [id number] Date of Birth: April 05, 1941   Medicare Observation Status Notification Given:  Yes    Corey Harold 08/09/2023, 12:54 PM

## 2023-08-09 NOTE — TOC Initial Note (Signed)
Transition of Care [facility]) - Initial/Assessment Note    Patient Details  Name: Sara [name] MRN: [id number] Date of Birth: 1941/08/07  [facility]) CM/SW Contact:    Elliot Gault, LCSW Phone Number: 08/09/2023, 10:14 AM  Clinical Narrative:                  Pt from home. PT recommending SNF rehab. Spoke with pt and family to assess and review dc planning. They are interested in rehab at [facility]. Updated that [facility] will request insurance authorization and refer to [facility].  MD states pt medically ready for dc. Will follow.  Expected Discharge Plan: Skilled Nursing Facility Barriers to Discharge: SNF Pending bed offer, Insurance Authorization   Patient Goals and CMS Choice Patient states their goals for this hospitalization and ongoing recovery are:: rehab CMS Medicare.gov Compare Post Acute Care list provided to:: Patient Represenative (must comment) Choice offered to / list presented to : Adult Children  ownership interest in [facility].provided to:: Adult Children    Expected Discharge Plan and Services In-house Referral: Clinical Social Work   Post Acute Care Choice: Skilled Nursing Facility Living arrangements for the past 2 months: Single Family Home                                      Prior Living Arrangements/Services Living arrangements for the past 2 months: Single Family Home Lives with:: Spouse Patient language and need for interpreter reviewed:: Yes Do you feel safe going back to the place where you live?: Yes      Need for Family Participation in Patient Care: No (Comment)     Criminal Activity/Legal Involvement Pertinent to Current Situation/Hospitalization: No - Comment as needed  Activities of Daily Living   ADL Screening (condition at time of admission) Independently performs ADLs?: No Does the patient have a NEW difficulty with bathing/dressing/toileting/self-feeding that is expected to last >3 days?: Yes (Initiates  electronic notice to provider for possible OT consult) Does the patient have a NEW difficulty with getting in/out of bed, walking, or climbing stairs that is expected to last >3 days?: Yes (Initiates electronic notice to provider for possible PT consult) Does the patient have a NEW difficulty with communication that is expected to last >3 days?: No Is the patient deaf or have difficulty hearing?: No Does the patient have difficulty seeing, even when wearing glasses/contacts?: No Does the patient have difficulty concentrating, remembering, or making decisions?: No  Permission Sought/Granted Permission sought to share information with : Oceanographer granted to share information with : Yes, Verbal Permission Granted     Permission granted to share info w AGENCY: [facility]        Emotional Assessment Appearance:: Appears stated age     Orientation: : Oriented to Self, Oriented to Place, Oriented to  Time, Oriented to Situation Alcohol / Substance Use: Not Applicable Psych Involvement: No (comment)  Admission diagnosis:  Hypokalemia [E87.6] Dizziness [R42] Fall [W19.XXXA] Closed displaced oblique fracture of shaft of left humerus, initial encounter [S42.332A] Accidental fall, initial encounter [W19.XXXA] Fracture of humeral head, closed, left, initial encounter [S42.292A] Patient Active Problem List   Diagnosis Date Noted   Fall 08/08/2023   Humerus fracture 08/08/2023   Anxiety 08/08/2023   Hypokalemia 12/14/2021   Hyponatremia 12/14/2021   Generalized weakness 12/14/2021   Dyspnea on exertion 12/14/2021   Leukocytosis 12/14/2021   Hiatal  hernia with gastroesophageal reflux 11/20/2012   Postherpetic neuralgia 09/19/2012   GERD (gastroesophageal reflux disease) 12/16/2011   IDA (iron deficiency anemia) 12/16/2011   Essential hypertension 12/16/2011   PCP:  Royann Shivers, PA-C Pharmacy:   [facility] - [place], [place] - [address]  [address] [place] [address] Phone: [phone] Fax: [phone]     Social Determinants of Health (SDOH) Social History: SDOH Screenings   Food Insecurity: No Food Insecurity (08/08/2023)  Housing: Low Risk  (08/08/2023)  Transportation Needs: No Transportation Needs (08/08/2023)  Utilities: Not At Risk (08/08/2023)  Tobacco Use: Low Risk  (08/08/2023)   SDOH Interventions:     Readmission Risk Interventions     No data to display

## 2023-08-10 DIAGNOSIS — W19XXXD Unspecified fall, subsequent encounter: Secondary | ICD-10-CM

## 2023-08-10 DIAGNOSIS — S42292A Other displaced fracture of upper end of left humerus, initial encounter for closed fracture: Secondary | ICD-10-CM | POA: Diagnosis present

## 2023-08-10 DIAGNOSIS — Y92009 Unspecified place in unspecified non-institutional (private) residence as the place of occurrence of the external cause: Secondary | ICD-10-CM | POA: Diagnosis not present

## 2023-08-10 DIAGNOSIS — T502X5A Adverse effect of carbonic-anhydrase inhibitors, benzothiadiazides and other diuretics, initial encounter: Secondary | ICD-10-CM | POA: Diagnosis present

## 2023-08-10 DIAGNOSIS — Z79899 Other long term (current) drug therapy: Secondary | ICD-10-CM | POA: Diagnosis not present

## 2023-08-10 DIAGNOSIS — Z800 Family history of malignant neoplasm of digestive organs: Secondary | ICD-10-CM | POA: Diagnosis not present

## 2023-08-10 DIAGNOSIS — E669 Obesity, unspecified: Secondary | ICD-10-CM | POA: Diagnosis present

## 2023-08-10 DIAGNOSIS — D72829 Elevated white blood cell count, unspecified: Secondary | ICD-10-CM | POA: Diagnosis present

## 2023-08-10 DIAGNOSIS — W010XXA Fall on same level from slipping, tripping and stumbling without subsequent striking against object, initial encounter: Secondary | ICD-10-CM | POA: Diagnosis present

## 2023-08-10 DIAGNOSIS — I951 Orthostatic hypotension: Secondary | ICD-10-CM | POA: Diagnosis present

## 2023-08-10 DIAGNOSIS — Z9049 Acquired absence of other specified parts of digestive tract: Secondary | ICD-10-CM | POA: Diagnosis not present

## 2023-08-10 DIAGNOSIS — E785 Hyperlipidemia, unspecified: Secondary | ICD-10-CM | POA: Diagnosis present

## 2023-08-10 DIAGNOSIS — Z6832 Body mass index (BMI) 32.0-32.9, adult: Secondary | ICD-10-CM | POA: Diagnosis not present

## 2023-08-10 DIAGNOSIS — Z90710 Acquired absence of both cervix and uterus: Secondary | ICD-10-CM | POA: Diagnosis not present

## 2023-08-10 DIAGNOSIS — F419 Anxiety disorder, unspecified: Secondary | ICD-10-CM | POA: Diagnosis present

## 2023-08-10 DIAGNOSIS — S42342A Displaced spiral fracture of shaft of humerus, left arm, initial encounter for closed fracture: Secondary | ICD-10-CM | POA: Diagnosis present

## 2023-08-10 DIAGNOSIS — E876 Hypokalemia: Secondary | ICD-10-CM | POA: Diagnosis present

## 2023-08-10 DIAGNOSIS — Z8249 Family history of ischemic heart disease and other diseases of the circulatory system: Secondary | ICD-10-CM | POA: Diagnosis not present

## 2023-08-10 DIAGNOSIS — K219 Gastro-esophageal reflux disease without esophagitis: Secondary | ICD-10-CM | POA: Diagnosis present

## 2023-08-10 DIAGNOSIS — I10 Essential (primary) hypertension: Secondary | ICD-10-CM | POA: Diagnosis present

## 2023-08-10 DIAGNOSIS — R110 Nausea: Secondary | ICD-10-CM | POA: Diagnosis present

## 2023-08-10 DIAGNOSIS — S42202D Unspecified fracture of upper end of left humerus, subsequent encounter for fracture with routine healing: Secondary | ICD-10-CM | POA: Diagnosis not present

## 2023-08-10 DIAGNOSIS — G629 Polyneuropathy, unspecified: Secondary | ICD-10-CM | POA: Diagnosis present

## 2023-08-10 LAB — CBC
HCT: 28.8 % — ABNORMAL LOW (ref 36.0–46.0)
Hemoglobin: 9.2 g/dL — ABNORMAL LOW (ref 12.0–15.0)
MCH: 28.4 pg (ref 26.0–34.0)
MCHC: 31.9 g/dL (ref 30.0–36.0)
MCV: 88.9 fL (ref 80.0–100.0)
Platelets: 157 10*3/uL (ref 150–400)
RBC: 3.24 MIL/uL — ABNORMAL LOW (ref 3.87–5.11)
RDW: 14.3 % (ref 11.5–15.5)
WBC: 8.5 10*3/uL (ref 4.0–10.5)
nRBC: 0 % (ref 0.0–0.2)

## 2023-08-10 LAB — COMPREHENSIVE METABOLIC PANEL
ALT: 16 U/L (ref 0–44)
AST: 15 U/L (ref 15–41)
Albumin: 2.7 g/dL — ABNORMAL LOW (ref 3.5–5.0)
Alkaline Phosphatase: 56 U/L (ref 38–126)
Anion gap: 5 (ref 5–15)
BUN: 14 mg/dL (ref 8–23)
CO2: 26 mmol/L (ref 22–32)
Calcium: 8.5 mg/dL — ABNORMAL LOW (ref 8.9–10.3)
Chloride: 105 mmol/L (ref 98–111)
Creatinine, Ser: 0.82 mg/dL (ref 0.44–1.00)
GFR, Estimated: >60 mL/min (ref >60)
Glucose, Bld: 111 mg/dL — ABNORMAL HIGH (ref 70–99)
Potassium: 3.8 mmol/L (ref 3.5–5.1)
Sodium: 136 mmol/L (ref 135–145)
Total Bilirubin: 0.6 mg/dL (ref <1.2)
Total Protein: 5.6 g/dL — ABNORMAL LOW (ref 6.5–8.1)

## 2023-08-10 LAB — MAGNESIUM: Magnesium: 1.8 mg/dL (ref 1.7–2.4)

## 2023-08-10 MED ORDER — GABAPENTIN 100 MG PO CAPS
100.0000 mg | ORAL_CAPSULE | Freq: Every day | ORAL | Status: DC
  Administered 2023-08-10: 100 mg via ORAL
  Filled 2023-08-10: qty 1

## 2023-08-10 MED ORDER — MORPHINE SULFATE (PF) 2 MG/ML IV SOLN: 1.0000 mg | INTRAVENOUS | Status: DC | PRN

## 2023-08-10 NOTE — Progress Notes (Addendum)
PROGRESS NOTE    Sara [name]  WGN:[id number] DOB: 10/11/1940 DOA: 08/08/2023 PCP: Sara [name]   Brief Narrative:    Sara [name] is a 82 y.o. female with medical history significant of GERD, hypertension, anxiety, hyperlipidemia, vertigo, and more presents the ED with a chief complaint of fall.  Patient reports that she fell like the room was spinning when she woke up.  She thought it was her normal vertigo and allergies.  It was intermittent in the day.  When she was walking from the kitchen to her bedroom she came around a corner in the dining room and became so dizzy that she fell down.  She landed on her left shoulder.  She is noted to have left proximal humerus fracture that has been evaluated by orthopedics with plans to follow-up further outpatient and maintain in sling for now.  She likely would not be a candidate for any further intervention.  She was also noted to have some significant hypokalemia likely secondary to HCTZ use which may have contributed to some weakness or dizziness as well.  This is improving.  PT has assessed her and recommends SNF which patient and family are agreeable to.  Placement is currently pending.  Subjective: The patient was complaining of some neuropathic pain overnight in her lower extremities.  Blood pressure remains soft over night. As low as 99/51, currently 100/60   Assessment & Plan:   Principal Problem:   Fall Active Problems:   Leukocytosis   GERD (gastroesophageal reflux disease)   Essential hypertension   Hypokalemia   Humerus fracture   Anxiety  Assessment and Plan:   Fall -related to dizziness (checking orthostatic BP) -no LOC -Did not hit head -Humerus fractures - likely non surgical -Ortho following-wi -pain control with scheduled Tylenol and ibuprofen as well as ice packs and avoid use of narcotics unless absolutely needed given some side effects of GI distress -shoulder immobilizer  Humerus fracture  secondary to above -Comminuted fracture of left humeral head with lateral displacement of the dominant fracture fragments - Ortho: Dr. Dallas Schimke following and will discussion with family -Ct of Left Shoulder-  reviewed  (NO Contraindication to surgical invention bedside from advanced age -she remains low to medium risk due to age alone-no history of cardiopulmonary disease or osteoporosis)  Hypokalemia-improving -K+ 2.7 on admission - given in ED -D/C  LR +KCL  -2/2 hydrochlorothiazide -Reports compliance with K+ supplement at home -Recheck a.m. labs  Anxiety -Continue xanax  Essential hypertension -Pressure remains soft  -Holding hydrochlorothiazide for now due to hypokalemia -BP stable -Plan to hold further use of HCTZ on discharge  -Orthostatic blood pressure positive for fluctuating blood pressure from laying, sitting, standing  GERD (gastroesophageal reflux disease) -Continue PPI   Neuropathy  -Patient requesting for her Neurontin 100 mg nightly to be restarted  obesity -BMI 32.56   DVT prophylaxis:Heparin Code Status: Full Family Communication: Daughters at bedside 11/6 Disposition Plan:  Status is: Observation The patient will require care spanning > 2 midnights and should be moved to inpatient because: Need for IVF and SNF placement.   Consultants:  Orthopedics  Procedures:  None  Antimicrobials:  None     Objective: Vitals:   08/09/23 0500 08/09/23 1237 08/09/23 2046 08/10/23 0403  BP: 107/73 129/79 (!) 99/51 100/60  Pulse: 81 82 74 72  Resp:   18 19  Temp: 98.8 F (37.1 C) 98.5 F (36.9 C) 98.7 F (37.1 C) 98.4 F (36.9 C)  TempSrc:  Oral Oral Oral Oral  SpO2: 92% 93% 96% 97%  Weight:      Height:        Intake/Output Summary (Last 24 hours) at 08/10/2023 1449 Last data filed at 08/10/2023 0900 Gross per 24 hour  Intake 980.52 ml  Output 700 ml  Net 280.52 ml   Filed Weights   08/08/23 0755  Weight: 80.7 kg          General:  AAO x 3,  cooperative, no distress;   HEENT:  Normocephalic, PERRL, otherwise with in Normal limits   Neuro:  CNII-XII intact. , normal motor and sensation, reflexes intact   Lungs:   Clear to auscultation BL, Respirations unlabored,  No wheezes / crackles  Cardio:    S1/S2, RRR, No murmure, No Rubs or Gallops   Abdomen:  Soft, non-tender, bowel sounds active all four quadrants, no guarding or peritoneal signs.  Muscular  skeletal:  Limited exam -global generalized weaknesses - in bed, able to move all 4 extremities,   2+ pulses,  symmetric, No pitting edema  Skin:  Dry, warm to touch, negative for any Rashes,  Wounds: Please see nursing documentation           Data Reviewed: I have personally reviewed following labs and imaging studies  CBC: Recent Labs  Lab 08/08/23 1034 08/09/23 0406 08/10/23 0406  WBC 12.3* 9.5 8.5  NEUTROABS  --  5.7  --   HGB 12.7 10.2* 9.2*  HCT 38.9 31.5* 28.8*  MCV 86.4 87.0 88.9  PLT 237 200 157   Basic Metabolic Panel: Recent Labs  Lab 08/08/23 1034 08/09/23 0406 08/10/23 0406  NA 136 135 136  K 2.7* 3.4* 3.8  CL 97* 101 105  CO2 29 26 26   GLUCOSE 121* 109* 111*  BUN 19 15 14   CREATININE 1.03* 0.84 0.82  CALCIUM 9.3 8.4* 8.5*  MG 1.9 1.8 1.8   GFR: Estimated Creatinine Clearance: 51.1 mL/min (by C-G formula based on SCr of 0.82 mg/dL). Liver Function Tests: Recent Labs  Lab 08/09/23 0406 08/10/23 0406  AST 12* 15  ALT 15 16  ALKPHOS 58 56  BILITOT 0.6 0.6  PROT 5.7* 5.6*  ALBUMIN 2.9* 2.7*    Sepsis Labs: Recent Labs  Lab 08/08/23 1034  PROCALCITON 0.15    No results found for this or any previous visit (from the past 240 hour(s)).       Radiology Studies: CT SHOULDER LEFT WO CONTRAST  Result Date: 08/09/2023 CLINICAL DATA:  Shoulder trauma.  Fracture of humerus or scapula. EXAM: CT OF THE UPPER LEFT EXTREMITY WITHOUT CONTRAST TECHNIQUE: Multidetector CT imaging of the upper left extremity was  performed according to the standard protocol. RADIATION DOSE REDUCTION: This exam was performed according to the departmental dose-optimization program which includes automated exposure control, adjustment of the mA and/or kV according to patient size and/or use of iterative reconstruction technique. COMPARISON:  Left shoulder radiographs 08/08/2023; [facility] chest 12/14/2021 and 02/07/2015 FINDINGS: Bones/Joint/Cartilage Acute, comminuted fracture of the greater tuberosity with up to 9 mm peripheral anterolateral (axial series 3, image 55) and 7 mm superior (coronal series 5, image 76) displacement of the peripheral fracture component. There is an adjacent acute, mildly comminuted fracture through the medial aspect of the greater tuberosity and the lesser tuberosity, extending deep to the bicipital groove, and extending through the anterior humeral head articular surface (anatomic neck) cortex (axial series 3, images 49 through 86) with similar 10 mm peripheral anterior and medial (axial series  3, image 61) and 10 mm superior (coronal series 5, image 60) displacement of the peripheral fracture component. There is an oblique fracture through the humeral surgical neck with up to 5 mm medial cortical step-off of the distal fracture component (coronal series 5, image 74). Additional acute spiral fracture of the proximal humeral diaphysis with approximately 24 degree lateral apex angulation of the fracture. Up to 5 mm medial and 15 mm (1 bone width) anterior displacement of the distal fracture component with respect to the proximal fracture component. This fracture involves an approximate 6 cm length of the humeral diaphysis. Mild-to-moderate glenohumeral joint space narrowing with mild peripheral osteophytosis. Moderate acromioclavicular joint space narrowing and peripheral osteophytosis. Mild-to-moderate lateral downsloping of the acromion with mild to moderate distal lateral subacromial spurring. Ligaments Suboptimally  assessed by CT. Muscles and Tendons Mild superior subscapularis muscle atrophy. Soft tissues Mild hematoma extending posteriorly from the dominant humeral head greater tuberosity fracture. Mild posttraumatic stranding within the distal deltoid musculature lateral to the proximal humeral diaphyseal fracture. Partial visualization of elevation of the left hemidiaphragm. Note is made that a large sliding hiatal hernia was seen on prior 12/14/2021 and 02/07/2015 radiographs as well as 02/26/2016 upper GI. This region is not well evaluated and only partially visualized, however question interval surgical correction of this hernia, with the portion of the stomach that are visualized now seen below the elevated left hemidiaphragm. Moderate scattered colonic diverticulosis within the visualized splenic flexure of the colon. Moderate atherosclerotic calcifications within the aortic arch. Moderate curvilinear and ground-glass likely scarring and subsegmental atelectasis within the left lower lung. IMPRESSION: 1. Acute, comminuted fracture of the greater tuberosity with up to 9 mm peripheral anterolateral and 7 mm superior displacement of the peripheral fracture component. 2. Acute, mildly comminuted fracture through the medial aspect of the greater tuberosity and the lesser tuberosity, extending deep to the bicipital groove, and extending through the anterior humeral head articular surface (anatomic neck) cortex with similar 10 mm peripheral anterior and medial and 10 mm superior displacement of the peripheral fracture component. 3. Acute oblique fracture through the humeral surgical neck with up to 5 mm medial cortical step-off of the distal fracture component. 4. Acute spiral fracture of the proximal humeral diaphysis with approximately 24 degree lateral apex angulation of the fracture. Up to 5 mm medial and 15 mm (1 bone width) anterior displacement of the distal fracture component with respect to the proximal fracture  component. Aortic Atherosclerosis (ICD10-I70.0). Electronically Signed   By: Neita Garnet M.D.   On: 08/09/2023 14:35        Scheduled Meds:  acetaminophen  1,000 mg Oral Q6H   ezetimibe  10 mg Oral Daily   gabapentin  100 mg Oral QHS   heparin  5,000 Units Subcutaneous Q8H   ibuprofen  800 mg Oral Q6H   pantoprazole  40 mg Oral Daily   Continuous Infusions:     LOS: 0 days    Time spent: 55 minutes    Kendell Bane, MD  [facility]  If 7PM-7AM, please contact night-coverage [email or web address] 08/10/2023, 2:49 PM

## 2023-08-10 NOTE — TOC Progression Note (Signed)
Transition of Care [facility]) - Progression Note    Patient Details  Name: Sara [name] MRN: [id number] Date of Birth: 03-22-1941  Transition of Care [facility]) CM/SW Contact  Erin Sons, [place] Phone Number: 08/10/2023, 10:35 AM  Clinical Narrative:       Expected Discharge Plan: Skilled Nursing Facility Barriers to Discharge: Insurance Authorization  Expected Discharge Plan and Services In-house Referral: Clinical Social Work   Post Acute Care Choice: Skilled Nursing Facility Living arrangements for the past 2 months: Single Family Home                                       Social Determinants of Health (SDOH) Interventions SDOH Screenings   Food Insecurity: No Food Insecurity (08/08/2023)  Housing: Low Risk  (08/08/2023)  Transportation Needs: No Transportation Needs (08/08/2023)  Utilities: Not At Risk (08/08/2023)  Tobacco Use: Low Risk  (08/08/2023)    Readmission Risk Interventions     No data to display

## 2023-08-10 NOTE — Plan of Care (Signed)
  Problem: Education: Goal: Knowledge of General Education information will improve Description Including pain rating scale, medication(s)/side effects and non-pharmacologic comfort measures Outcome: Progressing   Problem: Health Behavior/Discharge Planning: Goal: Ability to manage health-related needs will improve Outcome: Progressing   

## 2023-08-10 NOTE — TOC Progression Note (Signed)
Transition of Care [facility]) - Progression Note    Patient Details  Name: CRISTIANA YOCHIM MRN: [id number] Date of Birth: [birth year]  Transition [facility] Care [facility]) CM/SW Contact  Erin Sons, [place] Phone Number: 08/10/2023, 2:49 PM  Clinical Narrative:     SNF Auth for [facility] is approved for 7 days. Pt has 5 days to admit.  [facility] #[id number]  Expected Discharge Plan: Skilled Nursing Facility Barriers to Discharge: Continued Medical Work up  Expected Discharge Plan and Services In-house Referral: Clinical Social Work   Post Acute Care Choice: Skilled Nursing Facility Living arrangements for the past 2 months: Single Family Home                                       Social Determinants of Health (SDOH) Interventions SDOH Screenings   Food Insecurity: No Food Insecurity (08/08/2023)  Housing: Low Risk  (08/08/2023)  Transportation Needs: No Transportation Needs (08/08/2023)  Utilities: Not At Risk (08/08/2023)  Tobacco Use: Low Risk  (08/08/2023)    Readmission Risk Interventions     No data to display

## 2023-08-10 NOTE — Progress Notes (Signed)
Physical Therapy Treatment Patient Details Name: Sara [name] MRN: [id number] DOB: 06/30/1941 Today's Date: 08/10/2023   History of Present Illness Sara [name] is a 82 y.o. female with medical history significant of GERD, hypertension, anxiety, hyperlipidemia, vertigo, and more presents the ED with a chief complaint of fall.  Patient reports that she fell like the room was spinning when she woke up.  She thought it was her normal vertigo and allergies.  It was intermittent in the day.  When she was walking from the kitchen to her bedroom she came around a corner in the dining room and became so dizzy that she fell down.  She landed on her left shoulder.  She reports the room was spinning.  She did not pass out.  She did not hit her head.  She did not have any nausea, vomiting, flushing, diaphoresis at that time.  Patient reports she did just feel generally weak.  Patient reports that she did not take her meclizine when she thought that she had her normal vertigo.  She denies any other infectious symptoms or fever.  Patient reports that she is taking a potassium supplement at home.  She is not sure the dosage of it.  She has been taking it since she had hypokalemia secondary to hydrochlorothiazide in the past.  She is still on her hydrochlorothiazide for peripheral edema and blood pressure.  She reports that she has had a normal appetite.  No diarrhea.  She does not report that the symptoms felt like when she had hypokalemia in the past.  In the ED when they tried to stand patient up to try to ambulate her, she became nauseous but did not vomit.  ED provider believes this was related to pain medication.  Patient has family members that work at [employer].  They have reviewed her x-rays and would like to speak to Ortho in person.  Ortho was consulted from the ED and determined patient to not need surgery, but certified RN family member at bedside would like to discuss that assessment in further detail.    PT  Comments  Pt c/o dizziness with movements.  Assessed orthostatic measurements with noted increased BP from supine to sit.  Monitored dizziness and reports of reduction prior movement.  Pt able to stand for 3 minutes with use of QC for stability with no LOB episodes.  Able to  ambulate 58ft to chair with slow labored movements and use of AD for safety.  Reports of increased Lt UE pain at EOS, RN aware of status and pain.  Pt left in chair with call bell within reach and husband in room.    If plan is discharge home, recommend the following:     Can travel by private vehicle        Equipment Recommendations       Recommendations for Other Services       Precautions / Restrictions Precautions Precautions: Fall Restrictions Weight Bearing Restrictions: Yes LUE Weight Bearing: Non weight bearing     08/10/23 0944  Therapy Vitals  Patient Position (if appropriate) Orthostatic Vitals  Orthostatic Lying   BP- Lying 119/77  Pulse- Lying 79  Orthostatic Sitting  BP- Sitting (!) 173/93  Pulse- Sitting 96  Orthostatic Standing at 0 minutes  BP- Standing at 0 minutes 156/84  Pulse- Standing at 0 minutes 105  Orthostatic Standing at 3 minutes  BP- Standing at 3 minutes 140/85  Pulse- Standing at 3 minutes 105  Oxygen Therapy  O2  Device Room Air     Mobility  Bed Mobility Overal bed mobility: Independent Bed Mobility: Supine to Sit     Supine to sit: Min assist, Mod assist     General bed mobility comments: increased time, labored movement with most diffiuclty scooting to EOB    Transfers Overall transfer level: Needs assistance Equipment used: Quad cane Transfers: Sit to/from Stand Sit to Stand: Min assist, Mod assist           General transfer comment: c/o dizzness; cueing for hand placement to assist with standing    Ambulation/Gait Ambulation/Gait assistance: Mod assist Gait Distance (Feet): 4 Feet Assistive device: Quad cane Gait Pattern/deviations: Decreased  step length - right, Decreased step length - left, Decreased stride length, Trunk flexed Gait velocity: slow     General Gait Details: labored slow movements, short step with flexed trunk.  Able to ambulate 35ft to chair limited by weakness, fatigue and c/o dizziness that had reduced while standing prior movement   Stairs             Wheelchair Mobility     Tilt Bed    Modified Rankin (Stroke Patients Only)       Balance                                            Cognition Arousal: Alert Behavior During Therapy: WFL for tasks assessed/performed Overall Cognitive Status: Within Functional Limits for tasks assessed                                          Exercises Total Joint Exercises Ankle Circles/Pumps: AROM, Both, 10 reps, Supine Heel Slides: AROM, Both, 5 reps, Supine Hip ABduction/ADduction: AROM, Both, 5 reps, Seated (isometric pushing into therapist's hands) Long Arc Quad: Both, 10 reps, Seated    General Comments        Pertinent Vitals/Pain Pain Assessment Pain Assessment: No/denies pain Pain Location: L UE no pain at rest, increased to 5/10 at EOS Pain Descriptors / Indicators: Guarding, Grimacing Pain Intervention(s): Monitored during session, Limited activity within patient's tolerance, Repositioned    Home Living                          Prior Function            PT Goals (current goals can now be found in the care plan section)      Frequency           PT Plan      Co-evaluation              AM-PAC PT "6 Clicks" Mobility   Outcome Measure  Help needed turning from your back to your side while in a flat bed without using bedrails?: A Lot Help needed moving from lying on your back to sitting on the side of a flat bed without using bedrails?: A Lot Help needed moving to and from a bed to a chair (including a wheelchair)?: A Lot Help needed standing up from a chair using your  arms (e.g., wheelchair or bedside chair)?: A Lot Help needed to walk in hospital room?: A Lot Help needed climbing 3-5 steps with a railing? : A Lot 6 Click Score: 12  End of Session Equipment Utilized During Treatment: Gait belt Activity Tolerance: Patient tolerated treatment well;Patient limited by fatigue Patient left: in chair;with call bell/phone within reach;with family/visitor present Nurse Communication: Mobility status       Time: 7829-5621 PT Time Calculation (min) (ACUTE ONLY): 32 min  Charges:    $Therapeutic Activity: 23-37 mins PT General Charges $$ ACUTE PT VISIT: 1 Visit                     Becky Sax, LPTA/CLT; CBIS [phone]  Juel Burrow 08/10/2023, 9:50 AM

## 2023-08-10 NOTE — Plan of Care (Signed)
  Problem: Safety: Goal: Ability to remain free from injury will improve Outcome: Not Progressing   Problem: Pain Management: Goal: General experience of comfort will improve Outcome: Not Progressing

## 2023-08-10 NOTE — Progress Notes (Signed)
   08/10/23 0944  Vitals  Patient Position (if appropriate) Orthostatic Vitals  Orthostatic Lying   BP- Lying 119/77  Pulse- Lying 79  Orthostatic Sitting  BP- Sitting (!) 173/93  Pulse- Sitting 96  Orthostatic Standing at 0 minutes  BP- Standing at 0 minutes 156/84  Pulse- Standing at 0 minutes 105  Orthostatic Standing at 3 minutes  BP- Standing at 3 minutes 140/85  Pulse- Standing at 3 minutes 105  Oxygen Therapy  O2 Device Room Air

## 2023-08-11 ENCOUNTER — Encounter: Payer: Self-pay | Admitting: Internal Medicine

## 2023-08-11 ENCOUNTER — Non-Acute Institutional Stay (SKILLED_NURSING_FACILITY): Payer: PPO | Admitting: Internal Medicine

## 2023-08-11 DIAGNOSIS — S42202D Unspecified fracture of upper end of left humerus, subsequent encounter for fracture with routine healing: Secondary | ICD-10-CM

## 2023-08-11 DIAGNOSIS — S42292A Other displaced fracture of upper end of left humerus, initial encounter for closed fracture: Secondary | ICD-10-CM | POA: Diagnosis not present

## 2023-08-11 DIAGNOSIS — I10 Essential (primary) hypertension: Secondary | ICD-10-CM | POA: Diagnosis not present

## 2023-08-11 DIAGNOSIS — S42342A Displaced spiral fracture of shaft of humerus, left arm, initial encounter for closed fracture: Secondary | ICD-10-CM | POA: Diagnosis not present

## 2023-08-11 DIAGNOSIS — K219 Gastro-esophageal reflux disease without esophagitis: Secondary | ICD-10-CM | POA: Diagnosis not present

## 2023-08-11 DIAGNOSIS — W19XXXD Unspecified fall, subsequent encounter: Secondary | ICD-10-CM | POA: Diagnosis not present

## 2023-08-11 DIAGNOSIS — D72829 Elevated white blood cell count, unspecified: Secondary | ICD-10-CM

## 2023-08-11 DIAGNOSIS — E876 Hypokalemia: Secondary | ICD-10-CM | POA: Diagnosis not present

## 2023-08-11 DIAGNOSIS — D508 Other iron deficiency anemias: Secondary | ICD-10-CM

## 2023-08-11 LAB — CBC WITH DIFFERENTIAL/PLATELET
Abs Immature Granulocytes: 0.04 10*3/uL (ref 0.00–0.07)
Basophils Absolute: 0.1 10*3/uL (ref 0.0–0.1)
Basophils Relative: 1 %
Eosinophils Absolute: 0.2 10*3/uL (ref 0.0–0.5)
Eosinophils Relative: 2 %
HCT: 35 % — ABNORMAL LOW (ref 36.0–46.0)
Hemoglobin: 11.1 g/dL — ABNORMAL LOW (ref 12.0–15.0)
Immature Granulocytes: 0 %
Lymphocytes Relative: 23 %
Lymphs Abs: 2.3 10*3/uL (ref 0.7–4.0)
MCH: 28 pg (ref 26.0–34.0)
MCHC: 31.7 g/dL (ref 30.0–36.0)
MCV: 88.4 fL (ref 80.0–100.0)
Monocytes Absolute: 0.8 10*3/uL (ref 0.1–1.0)
Monocytes Relative: 8 %
Neutro Abs: 6.8 10*3/uL (ref 1.7–7.7)
Neutrophils Relative %: 66 %
Platelets: 206 10*3/uL (ref 150–400)
RBC: 3.96 MIL/uL (ref 3.87–5.11)
RDW: 14.2 % (ref 11.5–15.5)
WBC: 10.3 10*3/uL (ref 4.0–10.5)
nRBC: 0 % (ref 0.0–0.2)

## 2023-08-11 LAB — BASIC METABOLIC PANEL
Anion gap: 12 (ref 5–15)
BUN: 17 mg/dL (ref 8–23)
CO2: 21 mmol/L — ABNORMAL LOW (ref 22–32)
Calcium: 8.9 mg/dL (ref 8.9–10.3)
Chloride: 104 mmol/L (ref 98–111)
Creatinine, Ser: 1.08 mg/dL — ABNORMAL HIGH (ref 0.44–1.00)
GFR, Estimated: 51 mL/min — ABNORMAL LOW (ref >60)
Glucose, Bld: 162 mg/dL — ABNORMAL HIGH (ref 70–99)
Potassium: 3.8 mmol/L (ref 3.5–5.1)
Sodium: 137 mmol/L (ref 135–145)

## 2023-08-11 MED ORDER — ALPRAZOLAM 0.25 MG PO TABS: 0.2500 mg | ORAL_TABLET | Freq: Every evening | ORAL | 0 refills | Status: DC | PRN

## 2023-08-11 MED ORDER — ACETAMINOPHEN 500 MG PO TABS: 1000.0000 mg | ORAL_TABLET | Freq: Four times a day (QID) | ORAL | 0 refills | Status: DC

## 2023-08-11 MED ORDER — POTASSIUM CHLORIDE CRYS ER 10 MEQ PO TBCR: 20.0000 meq | EXTENDED_RELEASE_TABLET | Freq: Every day | ORAL | 0 refills | Status: DC

## 2023-08-11 MED ORDER — GABAPENTIN 100 MG PO CAPS: 100.0000 mg | ORAL_CAPSULE | Freq: Every day | ORAL | 0 refills | Status: DC

## 2023-08-11 MED ORDER — OXYCODONE HCL 5 MG PO TABS
2.5000 mg | ORAL_TABLET | Freq: Four times a day (QID) | ORAL | Status: DC
  Administered 2023-08-11: 2.5 mg via ORAL
  Filled 2023-08-11: qty 1

## 2023-08-11 NOTE — Plan of Care (Signed)
  Problem: Education: Goal: Knowledge of General Education information will improve Description: Including pain rating scale, medication(s)/side effects and non-pharmacologic comfort measures 08/11/2023 0436 by Flo Shanks, RN Outcome: Progressing 08/11/2023 0434 by Flo Shanks, RN Outcome: Progressing   Problem: Activity: Goal: Risk for activity intolerance will decrease 08/11/2023 0436 by Flo Shanks, RN Outcome: Progressing 08/11/2023 0434 by Flo Shanks, RN Outcome: Progressing   Problem: Nutrition: Goal: Adequate nutrition will be maintained Outcome: Progressing

## 2023-08-11 NOTE — Plan of Care (Signed)

## 2023-08-11 NOTE — Discharge Summary (Signed)
Physician Discharge Summary   Patient: Sara [name] MRN: [id number] DOB: 09-25-1941  Admit date:     08/08/2023  Discharge date: 08/11/23  Discharge Physician: Kendell Bane   PCP: Royann Shivers, PA-C   Recommendations at discharge:   Follow-up with orthopedic team -Dr. Dallas Schimke November 18 for further evaluation for possible surgical invention. BMP in next 3-7 days monitoring potassium level Continue PT OT, fall precautions BP medication has been discontinued-blood pressure running soft mildly orthostatic Continue oral hydration  Discharge Diagnoses: Principal Problem:   Fall Active Problems:   Leukocytosis   GERD (gastroesophageal reflux disease)   Essential hypertension   Hypokalemia   Humerus fracture   Anxiety  Sara [name] is a 82 y.o. female with medical history significant of GERD, hypertension, anxiety, hyperlipidemia, vertigo, and more presents the ED with a chief complaint of fall.  Patient reports that she fell like the room was spinning when she woke up.  She thought it was her normal vertigo and allergies.  It was intermittent in the day.  When she was walking from the kitchen to her bedroom she came around a corner in the dining room and became so dizzy that she fell down.  She landed on her left shoulder.  She is noted to have left proximal humerus fracture that has been evaluated by orthopedics with plans to follow-up further outpatient and maintain in sling for now.  She likely would not be a candidate for any further intervention.  She was also noted to have some significant hypokalemia likely secondary to HCTZ use which may have contributed to some weakness or dizziness as well.  This is improving.  PT has assessed her and recommends SNF which patient and family are agreeable to.  Placement is currently pending   Fall -Possibly related to dizziness (mildly positive for orthostatic hypotension-BP medication has been discontinued) -Continue oral  hydration -no LOC -Did not hit head -Humerus fractures - likely non surgical -Ortho follow-up as an outpatient November 18   -pain control with scheduled Tylenol and ibuprofen as well as ice packs and avoid use of narcotics unless absolutely needed given some side effects of GI distress -shoulder immobilizer   Humerus fracture secondary to above -Comminuted fracture of left humeral head with lateral displacement of the dominant fracture fragments - Ortho: Dr. Dallas Schimke was consulted, was following closely, plan to follow-up on November 18 for further evaluation possible surgical intervention -Ct of Left Shoulder-  reviewed   (No Contraindication to surgical invention aside  from advanced age -she remains low to medium risk due to age alone-no history of cardiopulmonary disease or osteoporosis)   Hypokalemia-improving -K+ 2.7 on admission>>>3.8 now  -Was repleted orally and with IV fluids -Home medication of HCTZ-discontinued -Continue 20 mill equivalent KCl for 7 days, repeating BMP,  Potassium supplement may be discontinued  Anxiety -Continue xanax   Essential hypertension -Pressure remains soft  -Home medication of hydrochlorothiazide, and diltiazem has been discontinued  -Continue oral hydration, -Positive orthostatic hypotension  -Caution with restarting BP medication   GERD (gastroesophageal reflux disease) -Continue PPI   Neuropathy  -Patient requesting for her Neurontin 100 mg nightly to be restarted   obesity -BMI 32.56   Consultants: Orthopedic team Procedures performed: No surgical invention, immobilization of the shoulder Disposition: Skilled nursing facility Diet recommendation:  Discharge Diet Orders (From admission, onward)     Start     Ordered   08/11/23 0000  Diet - low sodium heart healthy  08/11/23 1234           Carb modified diet DISCHARGE MEDICATION: Allergies as of 08/11/2023   No Known Allergies      Medication List      STOP taking these medications    diltiazem 120 MG 24 hr capsule Commonly known as: DILACOR XR   HYDROcodone-acetaminophen 5-325 MG tablet Commonly known as: NORCO/VICODIN       TAKE these medications    acetaminophen 500 MG tablet Commonly known as: TYLENOL Take 2 tablets (1,000 mg total) by mouth every 6 (six) hours.   ALPRAZolam 0.25 MG tablet Commonly known as: XANAX Take 1 tablet (0.25 mg total) by mouth at bedtime as needed for anxiety.   ezetimibe 10 MG tablet Commonly known as: ZETIA Take 10 mg by mouth daily.   gabapentin 100 MG capsule Commonly known as: NEURONTIN Take 1 capsule (100 mg total) by mouth at bedtime.   meclizine 25 MG tablet Commonly known as: ANTIVERT Take 25 mg by mouth 3 (three) times daily as needed for dizziness.   ondansetron 8 MG disintegrating tablet Commonly known as: ZOFRAN-ODT Take 1 tablet (8 mg total) by mouth every 8 (eight) hours as needed for nausea or vomiting.   oxyCODONE-acetaminophen 5-325 MG tablet Commonly known as: PERCOCET/ROXICET Take 1 tablet by mouth every 6 (six) hours as needed for severe pain (pain score 7-10).   pantoprazole 40 MG tablet Commonly known as: PROTONIX TAKE (1) TABLET TWICE DAILY.   potassium chloride 10 MEQ tablet Commonly known as: KLOR-CON M Take 2 tablets (20 mEq total) by mouth daily for 7 days. What changed:  medication strength when to take this        Contact information for follow-up providers     Oliver Barre, MD.   Specialties: Orthopedic Surgery, Sports Medicine Contact information: [address] [place] [address] [phone]              Contact information for after-discharge care     Destination     [facility] .   Service: Skilled Nursing Contact information: [address] [place] [address] [phone]                    Discharge Exam: Ceasar Mons Weights   08/08/23 0755  Weight: 80.7 kg         General:  AAO x 3,  cooperative, no distress;   HEENT:  Normocephalic, PERRL, otherwise with in Normal limits   Neuro:  CNII-XII intact. , normal motor and sensation, reflexes intact   Lungs:   Clear to auscultation BL, Respirations unlabored,  No wheezes / crackles  Cardio:    S1/S2, RRR, No murmure, No Rubs or Gallops   Abdomen:  Soft, non-tender, bowel sounds active all four quadrants, no guarding or peritoneal signs.  Muscular  skeletal:  Right shoulder-immobilized in the sling, due to fracture, limited range of motion  Limited exam -global generalized weaknesses - in bed, able to move all 4 extremities,   2+ pulses,  symmetric, No pitting edema  Skin:  Dry, warm to touch, negative for any Rashes,  Wounds: Please see nursing documentation          Condition at discharge: good  The results of significant diagnostics from this hospitalization (including imaging, microbiology, ancillary and laboratory) are listed below for reference.   Imaging Studies: CT SHOULDER LEFT WO CONTRAST  Result Date: 08/09/2023 CLINICAL DATA:  Shoulder trauma.  Fracture  of humerus or scapula. EXAM: CT OF THE UPPER LEFT EXTREMITY WITHOUT CONTRAST TECHNIQUE: Multidetector CT imaging of the upper left extremity was performed according to the standard protocol. RADIATION DOSE REDUCTION: This exam was performed according to the departmental dose-optimization program which includes automated exposure control, adjustment of the mA and/or kV according to patient size and/or use of iterative reconstruction technique. COMPARISON:  Left shoulder radiographs 08/08/2023; AP chest 12/14/2021 and 02/07/2015 FINDINGS: Bones/Joint/Cartilage Acute, comminuted fracture of the greater tuberosity with up to 9 mm peripheral anterolateral (axial series 3, image 55) and 7 mm superior (coronal series 5, image 76) displacement of the peripheral fracture component. There is an adjacent acute, mildly comminuted fracture  through the medial aspect of the greater tuberosity and the lesser tuberosity, extending deep to the bicipital groove, and extending through the anterior humeral head articular surface (anatomic neck) cortex (axial series 3, images 49 through 86) with similar 10 mm peripheral anterior and medial (axial series 3, image 61) and 10 mm superior (coronal series 5, image 60) displacement of the peripheral fracture component. There is an oblique fracture through the humeral surgical neck with up to 5 mm medial cortical step-off of the distal fracture component (coronal series 5, image 74). Additional acute spiral fracture of the proximal humeral diaphysis with approximately 24 degree lateral apex angulation of the fracture. Up to 5 mm medial and 15 mm (1 bone width) anterior displacement of the distal fracture component with respect to the proximal fracture component. This fracture involves an approximate 6 cm length of the humeral diaphysis. Mild-to-moderate glenohumeral joint space narrowing with mild peripheral osteophytosis. Moderate acromioclavicular joint space narrowing and peripheral osteophytosis. Mild-to-moderate lateral downsloping of the acromion with mild to moderate distal lateral subacromial spurring. Ligaments Suboptimally assessed by CT. Muscles and Tendons Mild superior subscapularis muscle atrophy. Soft tissues Mild hematoma extending posteriorly from the dominant humeral head greater tuberosity fracture. Mild posttraumatic stranding within the distal deltoid musculature lateral to the proximal humeral diaphyseal fracture. Partial visualization of elevation of the left hemidiaphragm. Note is made that a large sliding hiatal hernia was seen on prior 12/14/2021 and 02/07/2015 radiographs as well as 02/26/2016 upper GI. This region is not well evaluated and only partially visualized, however question interval surgical correction of this hernia, with the portion of the stomach that are visualized now seen  below the elevated left hemidiaphragm. Moderate scattered colonic diverticulosis within the visualized splenic flexure of the colon. Moderate atherosclerotic calcifications within the aortic arch. Moderate curvilinear and ground-glass likely scarring and subsegmental atelectasis within the left lower lung. IMPRESSION: 1. Acute, comminuted fracture of the greater tuberosity with up to 9 mm peripheral anterolateral and 7 mm superior displacement of the peripheral fracture component. 2. Acute, mildly comminuted fracture through the medial aspect of the greater tuberosity and the lesser tuberosity, extending deep to the bicipital groove, and extending through the anterior humeral head articular surface (anatomic neck) cortex with similar 10 mm peripheral anterior and medial and 10 mm superior displacement of the peripheral fracture component. 3. Acute oblique fracture through the humeral surgical neck with up to 5 mm medial cortical step-off of the distal fracture component. 4. Acute spiral fracture of the proximal humeral diaphysis with approximately 24 degree lateral apex angulation of the fracture. Up to 5 mm medial and 15 mm (1 bone width) anterior displacement of the distal fracture component with respect to the proximal fracture component. Aortic Atherosclerosis (ICD10-I70.0). Electronically Signed   By: Neita Garnet M.D.   On:  08/09/2023 14:35   DG Shoulder Left  Result Date: 08/08/2023 CLINICAL DATA:  Status post fall onto left shoulder EXAM: LEFT SHOULDER - 3 VIEW COMPARISON:  None Available. FINDINGS: Comminuted fracture of the left humeral head with lateral displacement of the dominant fracture fragments. Oblique fracture of the proximal humeral diaphysis with mild apex lateral angulation and 1 cortical shaft width medial displacement. No acute dislocation. IMPRESSION: 1. Comminuted fracture of the left humeral head with lateral displacement of the dominant fracture fragments. 2. Oblique fracture of the  proximal humeral diaphysis with mild apex lateral angulation and 1 cortical shaft width medial displacement. Electronically Signed   By: Agustin Cree M.D.   On: 08/08/2023 08:22    Microbiology: Results for orders placed or performed during the hospital encounter of 12/14/21  Resp Panel by RT-PCR (Flu A&B, Covid) Nasopharyngeal Swab     Status: None   Collection Time: 12/14/21 11:34 AM   Specimen: Nasopharyngeal Swab; Nasopharyngeal(NP) swabs in vial transport medium  Result Value Ref Range Status   SARS Coronavirus 2 by RT PCR NEGATIVE NEGATIVE Final    Comment: (NOTE) SARS-CoV-2 target nucleic acids are NOT DETECTED.  The SARS-CoV-2 RNA is generally detectable in upper respiratory specimens during the acute phase of infection. The lowest concentration of SARS-CoV-2 viral copies this assay can detect is 138 copies/mL. A negative result does not preclude SARS-Cov-2 infection and should not be used as the sole basis for treatment or other patient management decisions. A negative result may occur with  improper specimen collection/handling, submission of specimen other than nasopharyngeal swab, presence of viral mutation(s) within the areas targeted by this assay, and inadequate number of viral copies(<138 copies/mL). A negative result must be combined with clinical observations, patient history, and epidemiological information. The expected result is Negative.  Fact Sheet for Patients:  BloggerCourse.com  Fact Sheet for Healthcare Providers:  SeriousBroker.it  This test is no t yet approved or cleared by the [place] FDA and  has been authorized for detection and/or diagnosis of SARS-CoV-2 by FDA under an Emergency Use Authorization (EUA). This EUA will remain  in effect (meaning this test can be used) for the duration of the COVID-19 declaration under Section 564(b)(1) of the Act, 21 U.S.C.section 360bbb-3(b)(1), unless the  authorization is terminated  or revoked sooner.       Influenza A by PCR NEGATIVE NEGATIVE Final   Influenza B by PCR NEGATIVE NEGATIVE Final    Comment: (NOTE) The Xpert Xpress SARS-CoV-2/FLU/RSV plus assay is intended as an aid in the diagnosis of influenza from Nasopharyngeal swab specimens and should not be used as a sole basis for treatment. Nasal washings and aspirates are unacceptable for Xpert Xpress SARS-CoV-2/FLU/RSV testing.  Fact Sheet for Patients: BloggerCourse.com  Fact Sheet for Healthcare Providers: SeriousBroker.it  This test is not yet approved or cleared by the [place] FDA and has been authorized for detection and/or diagnosis of SARS-CoV-2 by FDA under an Emergency Use Authorization (EUA). This EUA will remain in effect (meaning this test can be used) for the duration of the COVID-19 declaration under Section 564(b)(1) of the Act, 21 U.S.C. section 360bbb-3(b)(1), unless the authorization is terminated or revoked.  Performed at [facility], [address], [place], [place] [address]   Urine Culture     Status: Abnormal   Collection Time: 12/14/21  7:20 PM   Specimen: Urine, Clean Catch  Result Value Ref Range Status   Specimen Description   Final    URINE, CLEAN  CATCH Performed at [facility], [address], [place], [place] [address]    Special Requests   Final    NONE Performed at [facility], [address] [place], [place] [address]    Culture MULTIPLE SPECIES PRESENT, SUGGEST RECOLLECTION (A)  Final   Report Status 12/16/2021 FINAL  Final    Labs: CBC: Recent Labs  Lab 08/08/23 1034 08/09/23 0406 08/10/23 0406 08/11/23 0943  WBC 12.3* 9.5 8.5 10.3  NEUTROABS  --  5.7  --  6.8  HGB 12.7 10.2* 9.2* 11.1*  HCT 38.9 31.5* 28.8* 35.0*  MCV 86.4 87.0 88.9 88.4  PLT 237 200 157 206   Basic Metabolic Panel: Recent Labs  Lab 08/08/23 1034 08/09/23 0406 08/10/23 0406  08/11/23 0943  NA 136 135 136 137  K 2.7* 3.4* 3.8 3.8  CL 97* 101 105 104  CO2 29 26 26  21*  GLUCOSE 121* 109* 111* 162*  BUN 19 15 14 17   CREATININE 1.03* 0.84 0.82 1.08*  CALCIUM 9.3 8.4* 8.5* 8.9  MG 1.9 1.8 1.8  --    Liver Function Tests: Recent Labs  Lab 08/09/23 0406 08/10/23 0406  AST 12* 15  ALT 15 16  ALKPHOS 58 56  BILITOT 0.6 0.6  PROT 5.7* 5.6*  ALBUMIN 2.9* 2.7*   CBG: No results for input(s): "GLUCAP" in the last 168 hours.  Discharge time spent: less than 30 minutes.  Signed: Kendell Bane, MD [facility] 08/11/2023

## 2023-08-11 NOTE — Progress Notes (Addendum)
NURSING HOME LOCATION:  [facility] ROOM NUMBER: [id number]  CODE STATUS:  Full  PCP:  Wayland Denis PA-C  This is a comprehensive admission note to this SNFperformed on this date less than 30 days from date of admission. Included are preadmission medical/surgical history; reconciled medication list; family history; social history and comprehensive review of systems.  Corrections and additions to the records were documented. Comprehensive physical exam was also performed. Additionally a clinical summary was entered for each active diagnosis pertinent to this admission in the Problem List to enhance continuity of care.  HPI: She was hospitalized 11/4 - 08/11/2023 with a proximal humeral fracture sustained in a fall.  This is in the context of pre-existing vertigo.  It was reported that upon awakening she was experiencing vertigo which she attributed to her chronic issues.  It was intermittent through the day but more pronounced than usual.  Patient was walking from the kitchen to the bedroom she turned the corner and became so dizzy she fell landing on the left shoulder. In the ED proximal humeral fracture was noted.  Orthopedics recommended sling without operative management at this time. Also she was noted to have significant hypokalemia with a potassium of 2.7 in the context HCTZ therapy.  Because of this her HCTZ was discontinued as well as her calcium channel blocker because of soft blood pressures. She exhibited leukocytosis with WBC of  12,000. Procalcitonin was WNL @ 0.15.  Initial H/H was 12.7/38.9; nadir values were 9.2/28.8.  Final H/H was 11.1/35 with normochromic, normocytic indices.  She does have a history of iron deficiency anemia.  Creatinine ranged from a low of 0.8 to up to a final value of 1.08.  Initial GFR was greater than 60 but final value was 51 indicating CKD stage IIIa.  While hospitalized glucoses ranged from 109 up to 162. With repletion final potassium  was 3.8. PT/OT saw the patient in consult and recommended SNF placement for rehab.  Past medical and surgical history: Includes history of anemia, GERD, essential hypertension, macular degeneration, & history of vertigo. Surgeries and procedures include abdominal hysterectomy, cholecystectomy, colonoscopy, EGD, and ORIF ankle fracture repair.  Family history: reviewed, non contributory due to advanced age.  Social history: Nondrinker, non-smoker.  She is retired Museum/gallery exhibitions officer.   Review of systems: She is an excellent historian.  She validates that her vertigo was worse than usual the day of the fall.  She stated "broke right shoulder and arm."  She believes that she will need replacement surgery at a later date.  She denies any other cardiac or neurologic prodrome prior to the fall.  She states that meclizine as needed does help her dizziness. She did state "my blood pressure was cutting up"; but she did not realize that her blood pressure was soft.  She states that she still has some dizziness when she "stirs around." Despite anemia , she denies any bleeding dyscrasias. She describes significant L shoulder area pain.  Constitutional: No fever, significant weight change, fatigue  Eyes: No redness, discharge, pain, vision change ENT/mouth: No nasal congestion, purulent discharge, earache, change in hearing, sore throat  Cardiovascular: No chest pain, palpitations, paroxysmal nocturnal dyspnea, claudication, edema  Respiratory: No cough, sputum production, hemoptysis, DOE, significant snoring, apnea Gastrointestinal: No heartburn, dysphagia, abdominal pain, nausea /vomiting, rectal bleeding, melena, change in bowels Genitourinary: No dysuria, hematuria, pyuria, incontinence, nocturia Dermatologic: No rash, pruritus, change in appearance of skin Neurologic: No headache, syncope, seizures, numbness, tingling Psychiatric: No significant  anxiety, depression, insomnia, anorexia Endocrine:  No change in hair/skin/nails, excessive thirst, excessive hunger, excessive urination  Hematologic/lymphatic: No significant bruising, lymphadenopathy, abnormal bleeding Allergy/immunology: No itchy/watery eyes, significant sneezing, urticaria, angioedema  Physical exam:  Pertinent or positive findings: She appears younger than stated age. Eyebrows are absent. Lower lids are slightly puffy.Abdomen is protuberant. Good pedal pulses. Minimal, trace edema @ sock line.LUE in sling.  General appearance: Adequately nourished; no acute distress, increased work of breathing is present.   Lymphatic: No lymphadenopathy about the head, neck, axilla. Eyes: No conjunctival inflammation or lid edema is present. There is no scleral icterus. Ears:  External ear exam shows no significant lesions or deformities.   Nose:  External nasal examination shows no deformity or inflammation. Nasal mucosa are pink and moist without lesions, exudates Oral exam: Lips and gums are healthy appearing.There is no oropharyngeal erythema or exudate. Neck:  No thyromegaly, masses, tenderness noted.    Heart:  Normal rate and regular rhythm. S1 and S2 normal without gallop, murmur, click, rub.  Lungs: Chest clear to auscultation without wheezes, rhonchi, rales, rubs. Abdomen: Bowel sounds are normal.  Abdomen is soft and nontender with no organomegaly, hernias, masses. GU: Deferred  Extremities:  No cyanosis, clubbing. Neurologic exam:  Balance, Rhomberg, finger to nose testing could not be completed due to clinical state Skin: Warm & dry w/o tenting. No significant lesions or rash.  See clinical summary under each active problem in the Problem List with associated updated therapeutic plan

## 2023-08-11 NOTE — Assessment & Plan Note (Signed)
PT/OT @ SNF as tolerated.Orthopedic F/U with Dr. Dallas Schimke as scheduled.

## 2023-08-11 NOTE — Assessment & Plan Note (Signed)
Nadir H/H 9.2/28.8; final values 11.1/35. Monitor for bleeding dyscrasias.

## 2023-08-11 NOTE — Assessment & Plan Note (Signed)
Antihypertensives being held as BP soft. Continue to monitor.

## 2023-08-11 NOTE — Assessment & Plan Note (Signed)
Procalcitonin WNL.  Final WBC 10,300.

## 2023-08-11 NOTE — Patient Instructions (Signed)
See assessment and plan under each diagnosis in the problem list and acutely for this visit 

## 2023-08-11 NOTE — Progress Notes (Signed)
ORTHOPAEDIC PROGRESS NOTE  Left comminuted proximal humerus fracture, with extension to a humeral shaft fracture  DOI: 08/08/2023  SUBJECTIVE: No issues overnight.  Pain is controlled with Tylenol and ibuprofen.  Occasional numbness and tingling in her fingers.  Family is available at the bedside.  OBJECTIVE: PE:  Elderly female.  No acute distress.  Alert and oriented.  Sling is fitting appropriately.  Minimal bruising or swelling is appreciated over the left shoulder.  No obvious deformity.  Slightly decreased sensation in the axillary nerve distribution.  She is fearful about trying to move the shoulder, so I am unable to assess the motor function of the axillary nerve.  Fingers are warm and well-perfused.  2+ radial pulse.  She is wiggling her fingers.  Vitals:   08/10/23 2002 08/11/23 0603  BP: (!) 111/56   Pulse: 77   Resp: 18 20  Temp: 98.5 F (36.9 C)   SpO2: 95% 98%      Latest Ref Rng & Units 08/10/2023    4:06 AM 08/09/2023    4:06 AM 08/08/2023   10:34 AM  CBC  WBC 4.0 - 10.5 K/uL 8.5  9.5  12.3   Hemoglobin 12.0 - 15.0 g/dL 9.2  28.4  13.2   Hematocrit 36.0 - 46.0 % 28.8  31.5  38.9   Platelets 150 - 400 K/uL 157  200  237    CT scan left shoulder  IMPRESSION: 1. Acute, comminuted fracture of the greater tuberosity with up to 9 mm peripheral anterolateral and 7 mm superior displacement of the peripheral fracture component. 2. Acute, mildly comminuted fracture through the medial aspect of the greater tuberosity and the lesser tuberosity, extending deep to the bicipital groove, and extending through the anterior humeral head articular surface (anatomic neck) cortex with similar 10 mm peripheral anterior and medial and 10 mm superior displacement of the peripheral fracture component. 3. Acute oblique fracture through the humeral surgical neck with up to 5 mm medial cortical step-off of the distal fracture component. 4. Acute spiral fracture of the proximal  humeral diaphysis with approximately 24 degree lateral apex angulation of the fracture. Up to 5 mm medial and 15 mm (1 bone width) anterior displacement of the distal fracture component with respect to the proximal fracture component.  ASSESSMENT: Sara [name] is a 82 y.o. female stable following mechanical fall, with comminuted fracture of the proximal humerus fracture and spiral fracture of the humeral shaft.  PLAN: Weightbearing: NWB LUE Incisional and dressing care: None Orthopedic device(s):  sling to LUE VTE prophylaxis: At discretion of the primary team, no orthopedic contraindications. Pain control: As needed; recommend judicious use of narcotics Follow - up plan:  To be determined.  I had an extensive discussion with the patient, her husband as well as her daughter in the hospital this morning.  We discussed the nature of her injury, which includes the comminuted proximal humerus fracture, as well as a fracture within the humeral shaft.  She is right-hand dominant.  She is currently undergoing ongoing treatment for other medical concerns, including issues with her blood pressure and vertigo.  She will ultimately be discharged to a skilled nursing facility, although exact timing has yet to be determined.  Once again, I reiterated that patient's sustaining this injury can recover, pain gets better and potentially recover most of her function.  It will take approximately 4 to 6 weeks before she starts to have sustained improvements in her pain.  If we were to proceed  with surgery, I would recommend a reverse shoulder replacement, with implant spanning the humeral shaft fracture.  I do believe this would give her the best chance for improved pain, as well is a more reliable return of function.  There are risks associated with the surgery, and these were discussed.  This does not need to be done urgently.  She does not need to remain in the hospital while making her decision.  It would be  best to proceed with surgery within a couple of weeks of the injury, and this was also discussed with the patient and her family.  All questions have been answered.  If she is discharged to a skilled nursing facility, I would like to see her back in clinic of the week of November 18.  Ultimately, if they decide to proceed with surgery, I would work to schedule surgery next week in order to allow her to continue to recover.   Contact information:     Cesia Orf A. Dallas Schimke, MD MS [facility] [address] [place],  [place]  [address] Phone: [phone] Fax: [phone]

## 2023-08-11 NOTE — Assessment & Plan Note (Signed)
Final K+ 3.8 off hydrochlorothiazide. Continue to monitor.

## 2023-08-11 NOTE — Progress Notes (Signed)
Physical Therapy Treatment Patient Details Name: Sara [name] MRN: [id number] DOB: 19-Feb-1941 Today's Date: 08/11/2023   History of Present Illness Sara [name] is a 82 y.o. female with medical history significant of GERD, hypertension, anxiety, hyperlipidemia, vertigo, and more presents the ED with a chief complaint of fall.  Patient reports that she fell like the room was spinning when she woke up.  She thought it was her normal vertigo and allergies.  It was intermittent in the day.  When she was walking from the kitchen to her bedroom she came around a corner in the dining room and became so dizzy that she fell down.  She landed on her left shoulder.  She reports the room was spinning.  She did not pass out.  She did not hit her head.  She did not have any nausea, vomiting, flushing, diaphoresis at that time.  Patient reports she did just feel generally weak.  Patient reports that she did not take her meclizine when she thought that she had her normal vertigo.  She denies any other infectious symptoms or fever.  Patient reports that she is taking a potassium supplement at home.  She is not sure the dosage of it.  She has been taking it since she had hypokalemia secondary to hydrochlorothiazide in the past.  She is still on her hydrochlorothiazide for peripheral edema and blood pressure.  She reports that she has had a normal appetite.  No diarrhea.  She does not report that the symptoms felt like when she had hypokalemia in the past.  In the ED when they tried to stand patient up to try to ambulate her, she became nauseous but did not vomit.  ED provider believes this was related to pain medication.  Patient has family members that work at [employer].  They have reviewed her x-rays and would like to speak to Ortho in person.  Ortho was consulted from the ED and determined patient to not need surgery, but certified RN family member at bedside would like to discuss that assessment in further detail.    PT  Comments  Pt supine in bed with family present.  Orthostatic vitals taken in supine, seated, standing and after standing for 3 minutes with decreased changes compared to yesterday.  Pt does continues to complain of dizziness/vertigo symptoms, monitored through session.  Independent bed mobility, increased time to complete.  Able to stand safely with no LOB, was limited by fatigue standing.  Min-mod A with gait training to chair, QC for safety.  EOS pt left in chair with call bell within reach and family present in room.    If plan is discharge home, recommend the following:     Can travel by private vehicle        Equipment Recommendations       Recommendations for Other Services       Precautions / Restrictions Precautions Precautions: Fall Restrictions Weight Bearing Restrictions: Yes LUE Weight Bearing: Non weight bearing     Mobility  Bed Mobility Overal bed mobility: Independent       Supine to sit: Supervision     General bed mobility comments: increased time, labored movement with most diffiuclty scooting to EOB    Transfers Overall transfer level: Needs assistance Equipment used: Quad cane Transfers: Sit to/from Stand Sit to Stand: Min assist, Mod assist           General transfer comment: c/o dizzness; cueing for hand placement to assist with standing  Ambulation/Gait Ambulation/Gait assistance: Mod assist Gait Distance (Feet): 5 Feet Assistive device: Quad cane Gait Pattern/deviations: Decreased step length - right, Decreased step length - left, Decreased stride length, Trunk flexed Gait velocity: slow     General Gait Details: labored slow movements, short step with flexed trunk.  Able to ambulate 76ft to chair limited by weakness, fatigue and c/o dizziness that had reduced while standing prior movement   Stairs             Wheelchair Mobility     Tilt Bed    Modified Rankin (Stroke Patients Only)       Balance                                             Cognition Arousal: Alert Behavior During Therapy: WFL for tasks assessed/performed Overall Cognitive Status: Within Functional Limits for tasks assessed                                          Exercises General Exercises - Lower Extremity Ankle Circles/Pumps: AROM, Both, 10 reps, Supine Gluteal Sets: AROM, Both, 10 reps, Supine Long Arc Quad: AROM, Both, Strengthening, 20 reps, Seated Heel Slides: AROM, 5 reps, Both, Supine Toe Raises: AROM, Both, 10 reps, Seated Heel Raises: AROM, Both, 10 reps, Seated    General Comments        Pertinent Vitals/Pain Pain Assessment Pain Assessment: No/denies pain Pain Location: Lt UE no pain at rest, pain wiht movement Pain Descriptors / Indicators: Guarding, Grimacing Pain Intervention(s): Limited activity within patient's tolerance, Repositioned, Monitored during session, Premedicated before session (Lt arm supported wiht pillow in chair at EOS)    Home Living                          Prior Function            PT Goals (current goals can now be found in the care plan section)      Frequency           PT Plan      Co-evaluation              AM-PAC PT "6 Clicks" Mobility   Outcome Measure  Help needed turning from your back to your side while in a flat bed without using bedrails?: A Little Help needed moving from lying on your back to sitting on the side of a flat bed without using bedrails?: A Little Help needed moving to and from a bed to a chair (including a wheelchair)?: A Little Help needed standing up from a chair using your arms (e.g., wheelchair or bedside chair)?: A Lot Help needed to walk in hospital room?: A Lot Help needed climbing 3-5 steps with a railing? : A Lot 6 Click Score: 15    End of Session Equipment Utilized During Treatment: Gait belt Activity Tolerance: Patient tolerated treatment well;Patient limited by  fatigue Patient left: in chair;with call bell/phone within reach;with family/visitor present Nurse Communication: Mobility status       Time: 0900-0930 PT Time Calculation (min) (ACUTE ONLY): 30 min  Charges:    $Therapeutic Activity: 23-37 mins PT General Charges $$ ACUTE PT VISIT: 1 Visit  Becky Sax, LPTA/CLT; CBIS [phone]  Juel Burrow 08/11/2023, 1:10 PM

## 2023-08-11 NOTE — TOC Transition Note (Signed)
Transition of Care [facility]) - CM/SW Discharge Note   Patient Details  Name: Sara [name] MRN: [id number] Date of Birth: [birth year]  [facility]) CM/SW Contact:  Elliot Gault, LCSW Phone Number: 08/11/2023, 12:54 PM   Clinical Narrative:     Pt stable for dc to [facility] today per MD. Updated Lynnea Ferrier at [facility]. They can admit pt today.  DC clinical sent electronically. RN to call report.  No other TOC needs for dc.    Barriers to Discharge: Barriers Resolved   Patient Goals and CMS Choice CMS Medicare.gov Compare Post Acute Care list provided to:: Patient Represenative (must comment) Choice offered to / list presented to : Adult Children  Discharge Placement                Patient chooses bed at: [facility]        Discharge Plan and Services Additional resources added to the After Visit Summary for   In-house Referral: Clinical Social Work   Post Acute Care Choice: Skilled Nursing Facility                               Social Determinants of Health (SDOH) Interventions SDOH Screenings   Food Insecurity: No Food Insecurity (08/08/2023)  Housing: Low Risk  (08/08/2023)  Transportation Needs: No Transportation Needs (08/08/2023)  Utilities: Not At Risk (08/08/2023)  Tobacco Use: Low Risk  (08/08/2023)     Readmission Risk Interventions     No data to display

## 2023-08-15 ENCOUNTER — Other Ambulatory Visit (HOSPITAL_COMMUNITY)
Admission: RE | Admit: 2023-08-15 | Discharge: 2023-08-15 | Disposition: A | Discharge status: Home / Self Care | Payer: PPO | Source: Skilled Nursing Facility | Attending: Adult Health | Admitting: Adult Health

## 2023-08-15 ENCOUNTER — Encounter: Payer: Self-pay | Admitting: Adult Health

## 2023-08-15 ENCOUNTER — Non-Acute Institutional Stay (SKILLED_NURSING_FACILITY): Payer: Self-pay | Admitting: Adult Health

## 2023-08-15 DIAGNOSIS — R531 Weakness: Secondary | ICD-10-CM | POA: Diagnosis not present

## 2023-08-15 DIAGNOSIS — R42 Dizziness and giddiness: Secondary | ICD-10-CM | POA: Diagnosis not present

## 2023-08-15 DIAGNOSIS — S42202D Unspecified fracture of upper end of left humerus, subsequent encounter for fracture with routine healing: Secondary | ICD-10-CM

## 2023-08-15 DIAGNOSIS — I700 Atherosclerosis of aorta: Secondary | ICD-10-CM | POA: Diagnosis not present

## 2023-08-15 DIAGNOSIS — E876 Hypokalemia: Secondary | ICD-10-CM | POA: Insufficient documentation

## 2023-08-15 LAB — BASIC METABOLIC PANEL
Anion gap: 9 (ref 5–15)
BUN: 14 mg/dL (ref 8–23)
CO2: 24 mmol/L (ref 22–32)
Calcium: 8.8 mg/dL — ABNORMAL LOW (ref 8.9–10.3)
Chloride: 105 mmol/L (ref 98–111)
Creatinine, Ser: 0.85 mg/dL (ref 0.44–1.00)
GFR, Estimated: >60 mL/min (ref >60)
Glucose, Bld: 109 mg/dL — ABNORMAL HIGH (ref 70–99)
Potassium: 4.2 mmol/L (ref 3.5–5.1)
Sodium: 138 mmol/L (ref 135–145)

## 2023-08-15 NOTE — Progress Notes (Unsigned)
Location:  [facility] Room Number: 128 Place of Service:  SNF (31)   CODE STATUS: full   Allergies  Allergen Reactions   Hydrochlorothiazide     Severe hypokalemia , K+ 2.7    Chief Complaint  Patient presents with   Acute Visit    Patient concerns     HPI:  She has numerous concerns. She had been hospitalized following a left humeral head fracture; which is now in a sling. She has been having vertigo. She had been taking lipoflavinoid for the vertigo and ringing in her ears. Her family does not want her to take narcotic pain medications. She does not tolerate meclizine due to nausea. She does have left shoulder pain.   Past Medical History:  Diagnosis Date   Anemia    IDA   GERD (gastroesophageal reflux disease)    Hernia of unspecified site of abdominal cavity without mention of obstruction or gangrene    Hypertension    Macular degeneration     Past Surgical History:  Procedure Laterality Date   ABDOMINAL HYSTERECTOMY     CHOLECYSTECTOMY     COLONOSCOPY     ESOPHAGOGASTRODUODENOSCOPY  12/21/2011   Procedure: ESOPHAGOGASTRODUODENOSCOPY (EGD);  Surgeon: Malissa Hippo, MD;  Location: [facility] SU[facility];  Service: Endoscopy;  Laterality: N/A;  730   ORIF ANKLE FRACTURE     right ankle   UPPER GASTROINTESTINAL ENDOSCOPY      Social History   Socioeconomic History   Marital status: Married    Spouse name: Not on file   Number of children: Not on file   Years of education: Not on file   Highest education level: Not on file  Occupational History   Not on file  Tobacco Use   Smoking status: Never   Smokeless tobacco: Never  Substance and Sexual Activity   Alcohol use: Not Currently    Comment: Very Rare   Drug use: No   Sexual activity: Yes    Birth control/protection: Surgical  Other Topics Concern   Not on file  Social History Narrative   Not on file   Social Determinants of Health   Financial Resource Strain: Not on file  Food  Insecurity: No Food Insecurity (08/08/2023)   Hunger Vital Sign    Worried About Running Out of Food in the Last Year: Never true    Ran Out of Food in the Last Year: Never true  Transportation Needs: No Transportation Needs (08/08/2023)   PRAPARE - Administrator, Civil Service (Medical): No    Lack of Transportation (Non-Medical): No  Physical Activity: Not on file  Stress: Not on file  Social Connections: Not on file  Intimate Partner Violence: Not At Risk (08/08/2023)   Humiliation, Afraid, Rape, and Kick questionnaire    Fear of Current or Ex-Partner: No    Emotionally Abused: No    Physically Abused: No    Sexually Abused: No   Family History  Problem Relation Age of Onset   Heart disease Mother    Aneurysm Father    Pancreatic cancer Brother    Healthy Daughter    Healthy Daughter    Healthy Daughter    Healthy Son    Healthy Son    Anesthesia problems Neg Hx    Hypotension Neg Hx    Malignant hyperthermia Neg Hx    Pseudochol deficiency Neg Hx       VITAL SIGNS BP 128/76   Pulse 80  Temp (!) 97 F (36.1 C)   Resp 20   Ht 5\' 2"  (1.575 m)   Wt 177 lb 9.6 oz (80.6 kg)   SpO2 94%   BMI 32.48 kg/m   Outpatient Encounter Medications as of 08/15/2023  Medication Sig   acetaminophen (TYLENOL) 500 MG tablet Take 2 tablets (1,000 mg total) by mouth every 6 (six) hours. (Patient taking differently: Take 650 mg by mouth every 6 (six) hours. Dose decreased from 500 mg 2 pills q 6 hrs prn to 325 mg 2 pills q 6 hrs prn to prevent hepatotoxicity)   ALPRAZolam (XANAX) 0.25 MG tablet Take 1 tablet (0.25 mg total) by mouth at bedtime as needed for anxiety.   ezetimibe (ZETIA) 10 MG tablet Take 10 mg by mouth daily.   gabapentin (NEURONTIN) 100 MG capsule Take 1 capsule (100 mg total) by mouth at bedtime.   meclizine (ANTIVERT) 25 MG tablet Take 25 mg by mouth 3 (three) times daily as needed for dizziness.   ondansetron (ZOFRAN-ODT) 8 MG disintegrating tablet Take  1 tablet (8 mg total) by mouth every 8 (eight) hours as needed for nausea or vomiting.   oxyCODONE-acetaminophen (PERCOCET/ROXICET) 5-325 MG tablet Take 1 tablet by mouth every 6 (six) hours as needed for severe pain (pain score 7-10).   pantoprazole (PROTONIX) 40 MG tablet TAKE (1) TABLET TWICE DAILY.   potassium chloride (KLOR-CON M) 10 MEQ tablet Take 2 tablets (20 mEq total) by mouth daily for 7 days.   No facility-administered encounter medications on file as of 08/15/2023.     SIGNIFICANT DIAGNOSTIC EXAMS  TODAY  08-08-23: wbc 12.3; hgb 12.7; hct 38.9; mcv 86.4 plt 237; glucose 121; bun 19; creat 1.03; k+ 2.7; na++ 136; ca 9.4; gfr 54; mag 1.9 08-09-23: wbc 9.5; hgb 10.2; hct 31.5 mcv 87.0 plt 200; glucose 109; bun 15; creat 0.84 ;k+ 3.4; na++ 135; ca 8.4; gfr >60; protein 5.7 albumin 2.9 08-15-23: glucose 109; bun 14; creat 0.85; k+ 4.2; na++ 138; ca 8.8; gfr >60   Review of Systems  Constitutional:  Positive for malaise/fatigue.  Respiratory:  Negative for cough and shortness of breath.   Cardiovascular:  Negative for chest pain, palpitations and leg swelling.  Gastrointestinal:  Positive for nausea. Negative for abdominal pain, constipation and heartburn.  Musculoskeletal:  Positive for joint pain. Negative for back pain and myalgias.  Skin: Negative.   Neurological:  Positive for dizziness.  Psychiatric/Behavioral:  The patient is not nervous/anxious.     Physical Exam Constitutional:      General: She is not in acute distress.    Appearance: She is well-developed. She is not diaphoretic.  Neck:     Thyroid: No thyromegaly.  Cardiovascular:     Rate and Rhythm: Normal rate and regular rhythm.     Pulses: Normal pulses.     Heart sounds: Normal heart sounds.  Pulmonary:     Effort: Pulmonary effort is normal. No respiratory distress.     Breath sounds: Normal breath sounds.  Abdominal:     General: Bowel sounds are normal. There is no distension.     Palpations:  Abdomen is soft.     Tenderness: There is no abdominal tenderness.  Musculoskeletal:     Cervical back: Neck supple.     Right lower leg: No edema.     Left lower leg: No edema.     Comments: Left arm in sling   Lymphadenopathy:     Cervical: No cervical adenopathy.  Skin:  General: Skin is warm and dry.  Neurological:     Mental Status: She is alert and oriented to person, place, and time.  Psychiatric:        Mood and Affect: Mood normal.      ASSESSMENT/ PLAN:  TODAY  Dizziness Closed fracture of proximal end of left humerus with routine healing, subsequent encounter, unspecified morphology   Will stop the following medications: meclizine; gabapentin; percocet Will start: lipoflavinoid twice daily Melatonin 5 mg nightly  Oxycodone 5 mg daily prior to therapy through 08-22-23.    Synthia Innocent NP [facility]   call [phone]

## 2023-08-16 DIAGNOSIS — R42 Dizziness and giddiness: Secondary | ICD-10-CM | POA: Insufficient documentation

## 2023-08-16 DIAGNOSIS — I700 Atherosclerosis of aorta: Secondary | ICD-10-CM | POA: Insufficient documentation

## 2023-08-16 MED ORDER — OXYCODONE HCL 5 MG PO TABS: 5.0000 mg | ORAL_TABLET | Freq: Every day | ORAL | 0 refills | Status: DC | PRN

## 2023-08-23 ENCOUNTER — Other Ambulatory Visit (INDEPENDENT_AMBULATORY_CARE_PROVIDER_SITE_OTHER): Payer: Self-pay

## 2023-08-23 ENCOUNTER — Ambulatory Visit (INDEPENDENT_AMBULATORY_CARE_PROVIDER_SITE_OTHER): Payer: PPO | Admitting: Orthopedic Surgery

## 2023-08-23 ENCOUNTER — Encounter: Payer: Self-pay | Admitting: Orthopedic Surgery

## 2023-08-23 VITALS — BP 102/63 | HR 81 | Ht 62.0 in | Wt 234.0 lb

## 2023-08-23 DIAGNOSIS — S42342A Displaced spiral fracture of shaft of humerus, left arm, initial encounter for closed fracture: Secondary | ICD-10-CM

## 2023-08-23 DIAGNOSIS — S42292A Other displaced fracture of upper end of left humerus, initial encounter for closed fracture: Secondary | ICD-10-CM | POA: Diagnosis not present

## 2023-08-23 DIAGNOSIS — M25512 Pain in left shoulder: Secondary | ICD-10-CM

## 2023-08-23 NOTE — Progress Notes (Signed)
Orthopaedic Clinic Return  Assessment: Sara [name] is a 82 y.o. female with the following: Valgus impacted left proximal humerus fracture Left proximal humerus shaft fracture   Plan: Sara [name] has done well since I saw her in the hospital.  Her pain is better.  Bruising is slowly improving.  Radiographs demonstrates stable overall alignment of both the proximal humerus fracture, as well as the proximal one third humeral shaft fracture.  There may be some slight interval displacement of the humeral shaft fracture.  Nonetheless, I think she has done very well.  Her pain continues to get better, and I have seen noticeable improvement in her pain, as well as her appearance since I saw her in the hospital last week.  As such, I would recommend we continue to monitor closely, and plan for nonoperative management as definitive treatment.  She is in agreement.  We have transitioned her to a Sarmiento brace, and a sling.  I would like see her back in 2 weeks for repeat evaluation.   Follow-up: Return in about 2 weeks (around 09/06/2023).   Subjective:  Chief Complaint  Patient presents with   Routine Post Op    L shoulder DOI 08/08/23    History of Present Illness: Sara [name] is a 82 y.o. female who returns to clinic for evaluation of left shoulder pain.  I saw her in the hospital as a consult, approximately 7-10 days ago.  Since then, she has been discharged to the [facility] center.  She is progressing appropriately.  Her pain is much better.  She is relying primarily on Tylenol.  She has no numbness or tingling in the left upper extremity.  She remains hopeful to avoid surgery.  She is right-hand dominant.  Review of Systems: No fevers or chills No numbness or tingling No chest pain No shortness of breath No bowel or bladder dysfunction No GI distress No headaches   Objective: BP 102/63   Pulse 81   Ht 5\' 2"  (1.575 m)   Wt 234 lb (106.1 kg)   BMI 42.80 kg/m   Physical  Exam:  Alert and oriented.  No acute distress.  She is using a cane in the right hand to assist with ambulation.  Left shoulder with bruising, especially the posterior aspect.  No obvious deformity.  Sensation intact over the axillary nerve distribution.  Sensation intact throughout the left hand.  Fingers are warm and well-perfused.  IMAGING: I personally ordered and reviewed the following images:   X-rays of the left shoulder were obtained in clinic today.  These are compared to prior x-rays.  There is a comminuted four-part proximal humerus fracture, with displacement of the greater tuberosity.  The glenohumeral joint is reduced.  There is also a spiral appearing fracture in the proximal one third shaft of the humerus.  There has been slight worsening of the angulation and displacement at the fracture site, compared to prior x-rays.  Impression: Left proximal humerus fracture in stable alignment, with mild interval worsening of a humeral shaft fracture  Oliver Barre, MD 08/23/2023 10:36 PM

## 2023-08-23 NOTE — Patient Instructions (Signed)
Recommend Sarmiento brace and sling at all times for the left arm  Ok to remove for hygiene  Consider Tramadol as needed for breakthrough pain  Tylenol and meloxicam as needed  Follow up in 2 weeks

## 2023-08-24 ENCOUNTER — Telehealth: Payer: Self-pay | Admitting: Orthopedic Surgery

## 2023-08-24 NOTE — Telephone Encounter (Signed)
Dr. Dallas Schimke pt - Sara [name], the patient's daughter lvm stating the pt was seen yesterday and that Dr.Cairns was going to call in Tramadol for her and it hasn't been sent in yet.  She would like a call back [phone]

## 2023-08-25 ENCOUNTER — Non-Acute Institutional Stay (SKILLED_NURSING_FACILITY): Payer: PRIVATE HEALTH INSURANCE | Admitting: Adult Health

## 2023-08-25 ENCOUNTER — Encounter: Payer: Self-pay | Admitting: Adult Health

## 2023-08-25 ENCOUNTER — Other Ambulatory Visit: Payer: Self-pay | Admitting: Adult Health

## 2023-08-25 DIAGNOSIS — I10 Essential (primary) hypertension: Secondary | ICD-10-CM | POA: Diagnosis not present

## 2023-08-25 DIAGNOSIS — I700 Atherosclerosis of aorta: Secondary | ICD-10-CM

## 2023-08-25 DIAGNOSIS — S42202D Unspecified fracture of upper end of left humerus, subsequent encounter for fracture with routine healing: Secondary | ICD-10-CM

## 2023-08-25 MED ORDER — OMEPRAZOLE 40 MG PO CPDR: 40.0000 mg | DELAYED_RELEASE_CAPSULE | Freq: Two times a day (BID) | ORAL | 0 refills | Status: DC

## 2023-08-25 MED ORDER — EZETIMIBE 10 MG PO TABS: 10.0000 mg | ORAL_TABLET | Freq: Every day | ORAL | 0 refills | Status: AC

## 2023-08-25 MED ORDER — MELOXICAM 7.5 MG PO TABS: 7.5000 mg | ORAL_TABLET | Freq: Every day | ORAL | 0 refills | Status: DC

## 2023-08-25 NOTE — Progress Notes (Signed)
Location:  [facility] Room Number: 128-P Place of Service:  SNF (31)   CODE STATUS: Full Code  Allergies  Allergen Reactions   Hydrochlorothiazide     Severe hypokalemia , K+ 2.7    Chief Complaint  Patient presents with   Discharge Note    HPI:  She is being discharged to home with home health for pt/ot. She will not need any dme; she will need her prescriptions written and will need to follow up with her medical provider. She had been hospitalized for a left humeral fracture. She was admitted to this facility for short term rehab. Therapy: ambulate 140 feet with SPC with supervision; upper body: supervision; lower body min assist; transfer supervision; bed mobility contact guard; steps with min assist; car transfers min assist.   Past Medical History:  Diagnosis Date   Anemia    IDA   GERD (gastroesophageal reflux disease)    Hernia of unspecified site of abdominal cavity without mention of obstruction or gangrene    Hypertension    Macular degeneration     Past Surgical History:  Procedure Laterality Date   ABDOMINAL HYSTERECTOMY     CHOLECYSTECTOMY     COLONOSCOPY     ESOPHAGOGASTRODUODENOSCOPY  12/21/2011   Procedure: ESOPHAGOGASTRODUODENOSCOPY (EGD);  Surgeon: Malissa Hippo, MD;  [facility]: [facility];  Service: Endoscopy;  Laterality: N/A;  730   ORIF ANKLE FRACTURE     right ankle   UPPER GASTROINTESTINAL ENDOSCOPY      Social History   Socioeconomic History   Marital status: Married    Spouse name: Not on file   Number of children: Not on file   Years of education: Not on file   Highest education level: Not on file  Occupational History   Not on file  Tobacco Use   Smoking status: Never   Smokeless tobacco: Never  Substance and Sexual Activity   Alcohol use: Not Currently    Comment: Very Rare   Drug use: No   Sexual activity: Yes    Birth control/protection: Surgical  Other Topics Concern   Not on file  Social  History Narrative   Not on file   Social Determinants of Health   Financial Resource Strain: Not on file  Food Insecurity: No Food Insecurity (08/08/2023)   Hunger Vital Sign    Worried About Running Out of Food in the Last Year: Never true    Ran Out of Food in the Last Year: Never true  Transportation Needs: No Transportation Needs (08/08/2023)   PRAPARE - Administrator, Civil Service (Medical): No    Lack of Transportation (Non-Medical): No  Physical Activity: Not on file  Stress: Not on file  Social Connections: Not on file  Intimate Partner Violence: Not At Risk (08/08/2023)   Humiliation, Afraid, Rape, and Kick questionnaire    Fear of Current or Ex-Partner: No    Emotionally Abused: No    Physically Abused: No    Sexually Abused: No   Family History  Problem Relation Age of Onset   Heart disease Mother    Aneurysm Father    Pancreatic cancer Brother    Healthy Daughter    Healthy Daughter    Healthy Daughter    Healthy Son    Healthy Son    Anesthesia problems Neg Hx    Hypotension Neg Hx    Malignant hyperthermia Neg Hx    Pseudochol deficiency Neg Hx  VITAL SIGNS BP 121/70   Pulse 83   Temp (!) 97 F (36.1 C)   Ht 5\' 2"  (1.575 m)   SpO2 96%   BMI 42.80 kg/m   Outpatient Encounter Medications as of 08/25/2023  Medication Sig   acetaminophen (TYLENOL) 650 MG CR tablet Take 650 mg by mouth every 8 (eight) hours.   ezetimibe (ZETIA) 10 MG tablet Take 10 mg by mouth daily.   melatonin 5 MG TABS Take 5 mg by mouth at bedtime.   meloxicam (MOBIC) 7.5 MG tablet Take 7.5 mg by mouth daily.   omeprazole (PRILOSEC) 40 MG capsule Take 40 mg by mouth in the morning and at bedtime.   Vitamins-Lipotropics (LIPOFLAVONOID PO) Take 1 tablet by mouth 2 (two) times daily.   gabapentin (NEURONTIN) 100 MG capsule Take 1 capsule (100 mg total) by mouth at bedtime. (Patient not taking: Reported on 08/25/2023)   meclizine (ANTIVERT) 25 MG tablet Take 25 mg  by mouth 3 (three) times daily as needed for dizziness. (Patient not taking: Reported on 08/25/2023)   ondansetron (ZOFRAN-ODT) 8 MG disintegrating tablet Take 1 tablet (8 mg total) by mouth every 8 (eight) hours as needed for nausea or vomiting. (Patient not taking: Reported on 08/25/2023)   oxyCODONE (ROXICODONE) 5 MG immediate release tablet Take 1 tablet (5 mg total) by mouth daily as needed for severe pain (pain score 7-10) (give prior therapy). (Patient not taking: Reported on 08/23/2023)   pantoprazole (PROTONIX) 40 MG tablet TAKE (1) TABLET TWICE DAILY. (Patient not taking: Reported on 08/25/2023)   potassium chloride (KLOR-CON M) 10 MEQ tablet Take 2 tablets (20 mEq total) by mouth daily for 7 days.   [DISCONTINUED] ALPRAZolam (XANAX) 0.25 MG tablet Take 1 tablet (0.25 mg total) by mouth at bedtime as needed for anxiety. (Patient not taking: Reported on 08/25/2023)   No facility-administered encounter medications on file as of 08/25/2023.     SIGNIFICANT DIAGNOSTIC EXAMS  TODAY  08-08-23: wbc 12.3; hgb 12.7; hct 38.9; mcv 86.4 plt 237; glucose 121; bun 19; creat 1.03; k+ 2.7; na++ 136; ca 9.4; gfr 54; mag 1.9 08-09-23: wbc 9.5; hgb 10.2; hct 31.5 mcv 87.0 plt 200; glucose 109; bun 15; creat 0.84 ;k+ 3.4; na++ 135; ca 8.4; gfr >60; protein 5.7 albumin 2.9 08-15-23: glucose 109; bun 14; creat 0.85; k+ 4.2; na++ 138; ca 8.8; gfr >60   Review of Systems  Constitutional:  Negative for malaise/fatigue.  Respiratory:  Negative for cough and shortness of breath.   Cardiovascular:  Negative for chest pain, palpitations and leg swelling.  Gastrointestinal:  Negative for abdominal pain, constipation and heartburn.  Musculoskeletal:  Negative for back pain, joint pain and myalgias.  Skin: Negative.   Neurological:  Negative for dizziness.  Psychiatric/Behavioral:  The patient is not nervous/anxious.     Physical Exam Constitutional:      General: She is not in acute distress.     Appearance: She is well-developed. She is not diaphoretic.  Neck:     Thyroid: No thyromegaly.  Cardiovascular:     Rate and Rhythm: Normal rate and regular rhythm.     Pulses: Normal pulses.     Heart sounds: Normal heart sounds.  Pulmonary:     Effort: Pulmonary effort is normal. No respiratory distress.     Breath sounds: Normal breath sounds.  Abdominal:     General: Bowel sounds are normal. There is no distension.     Palpations: Abdomen is soft.     Tenderness:  There is no abdominal tenderness.  Musculoskeletal:     Cervical back: Neck supple.     Right lower leg: No edema.     Left lower leg: No edema.     Comments: Left arm in sling    Lymphadenopathy:     Cervical: No cervical adenopathy.  Skin:    General: Skin is warm and dry.  Neurological:     Mental Status: She is alert and oriented to person, place, and time.  Psychiatric:        Mood and Affect: Mood normal.     ASSESSMENT/ PLAN:  Patient is being discharged with the following home health services:  pt/ot to evaluate and treat as indicated for gait balance strength adl training.   Patient is being discharged with the following durable medical equipment:  none needed  Patient has been advised to f/u with their PCP in 1-2 weeks to for a transitions of care visit.  Social services at their facility was responsible for arranging this appointment.  Pt was provided with adequate prescriptions of noncontrolled medications to reach the scheduled appointment .  For controlled substances, a limited supply was provided as appropriate for the individual patient.  If the pt normally receives these medications from a pain clinic or has a contract with another physician, these medications should be received from that clinic or physician only).    A 30 day supply of her prescription medications have been sent to [facility] pharmacy   Time spent with patient 35 minutes: medications home health dme needs   Synthia Innocent  NP [facility]   call [phone]

## 2023-08-26 MED ORDER — TRAMADOL HCL 50 MG PO TABS: 50.0000 mg | ORAL_TABLET | Freq: Four times a day (QID) | ORAL | 0 refills | Status: DC | PRN

## 2023-08-26 NOTE — Telephone Encounter (Signed)
Pt is back home an would like to have rx sent to [facility].

## 2023-09-06 ENCOUNTER — Encounter: Payer: Self-pay | Admitting: Orthopedic Surgery

## 2023-09-06 ENCOUNTER — Other Ambulatory Visit (INDEPENDENT_AMBULATORY_CARE_PROVIDER_SITE_OTHER): Payer: PPO

## 2023-09-06 ENCOUNTER — Ambulatory Visit (INDEPENDENT_AMBULATORY_CARE_PROVIDER_SITE_OTHER): Payer: PPO | Admitting: Orthopedic Surgery

## 2023-09-06 DIAGNOSIS — S42292A Other displaced fracture of upper end of left humerus, initial encounter for closed fracture: Secondary | ICD-10-CM

## 2023-09-06 DIAGNOSIS — S42342A Displaced spiral fracture of shaft of humerus, left arm, initial encounter for closed fracture: Secondary | ICD-10-CM

## 2023-09-06 DIAGNOSIS — S42342D Displaced spiral fracture of shaft of humerus, left arm, subsequent encounter for fracture with routine healing: Secondary | ICD-10-CM | POA: Diagnosis not present

## 2023-09-06 DIAGNOSIS — S42292D Other displaced fracture of upper end of left humerus, subsequent encounter for fracture with routine healing: Secondary | ICD-10-CM

## 2023-09-07 ENCOUNTER — Encounter: Payer: Self-pay | Admitting: Orthopedic Surgery

## 2023-09-07 NOTE — Progress Notes (Signed)
Orthopaedic Clinic Return  Assessment: Sara [name] is a 82 y.o. female with the following: Valgus impacted left proximal humerus fracture Left proximal humerus shaft fracture   Plan: Sara [name] is improving.  She still has some pain, but overall it is better.  Radiographs remain stable.  There is slight angulation of the humeral shaft fracture, but this has not worsened since I last saw her.  I believe the Sarmiento brace has been effective.  Injury was approximately 1 month ago.  We will plan to continue with the brace for another month.  Repeat radiographs at the next visit.  At that point, we will start to advance her activity, unless there is gross motion at the fracture site.   Follow-up: Return in about 2 weeks (around 09/20/2023).   Subjective:  Chief Complaint  Patient presents with   Fracture    L shoulder DOI 08/08/23    History of Present Illness: Sara [name] is a 82 y.o. female who returns to clinic for evaluation of left shoulder pain.  She fell and sustained a left proximal humerus fracture, as well as a humeral shaft fracture, approximately 1 month ago.  She was initially treated in a sling, and was transition to a Sarmiento brace approximately 1 week ago.  She has done well.  Overall, her pain is better, although she does have some pain in the left shoulder.  She continues to use a sling.  Pain is controlled with occasional tramadol.  No numbness or tingling.  Review of Systems: No fevers or chills No numbness or tingling No chest pain No shortness of breath No bowel or bladder dysfunction No GI distress No headaches   Objective: There were no vitals taken for this visit.  Physical Exam:  Alert and oriented.  No acute distress.  She is using a cane in the right hand to assist with ambulation.  Left shoulder with improved bruising.  Mild swelling.  No gross motion at the fracture site.  Sensation intact in the axillary nerve distribution.  Fingers  are warm and well-perfused.  IMAGING: I personally ordered and reviewed the following images:   X-rays of the left shoulder were obtained in clinic today.  These are compared to available x-rays.  Proximal humeral shaft fracture remains in stable position.  There has been no interval displacement.  No obvious callus formation.  Proximal humerus fracture remains unchanged.  Valgus impacted alignment overall, with mild displacement of the greater tuberosity.  Overall alignment is unchanged.  Glenohumeral joint is reduced.  Impression: Left proximal humerus fracture and humeral shaft fracture in stable alignment without further displacement  Oliver Barre, MD 09/07/2023 8:35 AM

## 2023-09-20 ENCOUNTER — Encounter: Payer: Self-pay | Admitting: Orthopedic Surgery

## 2023-09-20 ENCOUNTER — Other Ambulatory Visit (INDEPENDENT_AMBULATORY_CARE_PROVIDER_SITE_OTHER): Payer: PPO

## 2023-09-20 ENCOUNTER — Ambulatory Visit (INDEPENDENT_AMBULATORY_CARE_PROVIDER_SITE_OTHER): Payer: PPO | Admitting: Orthopedic Surgery

## 2023-09-20 DIAGNOSIS — S42342D Displaced spiral fracture of shaft of humerus, left arm, subsequent encounter for fracture with routine healing: Secondary | ICD-10-CM | POA: Diagnosis not present

## 2023-09-20 DIAGNOSIS — S42292D Other displaced fracture of upper end of left humerus, subsequent encounter for fracture with routine healing: Secondary | ICD-10-CM

## 2023-09-20 NOTE — Progress Notes (Unsigned)
Orthopaedic Clinic Return  Assessment: Sara [name] is a 82 y.o. female with the following: Valgus impacted left proximal humerus fracture Left proximal humerus shaft fracture   Plan: Sara [name] is improving.  She still has some pain, but overall it is better.  Radiographs remain stable.  There is slight angulation of the humeral shaft fracture, but this has not worsened since I last saw her.  I believe the Sarmiento brace has been effective.  Injury was approximately 1 month ago.  We will plan to continue with the brace for another month.  Repeat radiographs at the next visit.  At that point, we will start to advance her activity, unless there is gross motion at the fracture site.   Follow-up: No follow-ups on file.   Subjective:  Chief Complaint  Patient presents with   Fracture    L shoulder DOI 08/08/23    History of Present Illness: Sara [name] is a 82 y.o. female who returns to clinic for evaluation of left shoulder pain.  She fell and sustained a left proximal humerus fracture, as well as a humeral shaft fracture, approximately 1 month ago.  She was initially treated in a sling, and was transition to a Sarmiento brace approximately 1 week ago.  She has done well.  Overall, her pain is better, although she does have some pain in the left shoulder.  She continues to use a sling.  Pain is controlled with occasional tramadol.  No numbness or tingling.  Review of Systems: No fevers or chills No numbness or tingling No chest pain No shortness of breath No bowel or bladder dysfunction No GI distress No headaches   Objective: There were no vitals taken for this visit.  Physical Exam:  Alert and oriented.  No acute distress.  She is using a cane in the right hand to assist with ambulation.  Left shoulder with improved bruising.  Mild swelling.  No gross motion at the fracture site.  Sensation intact in the axillary nerve distribution.  Fingers are warm and  well-perfused.  IMAGING: I personally ordered and reviewed the following images:   X-rays of the left shoulder were obtained in clinic today.  These are compared to available x-rays.  Proximal humeral shaft fracture remains in stable position.  There has been no interval displacement.  No obvious callus formation.  Proximal humerus fracture remains unchanged.  Valgus impacted alignment overall, with mild displacement of the greater tuberosity.  Overall alignment is unchanged.  Glenohumeral joint is reduced.  Impression: Left proximal humerus fracture and humeral shaft fracture in stable alignment without further displacement  Oliver Barre, MD 09/20/2023 2:24 PM

## 2023-09-20 NOTE — Patient Instructions (Signed)
Continue to wear the brace for another week.  In 1 week, you can remove the brace.  Discontinue using the sling  In 1 week, can initiate passive range of motion of the left shoulder  Medicines as needed  Follow-up in 1 month.

## 2023-10-01 ENCOUNTER — Telehealth: Payer: Self-pay | Admitting: Orthopedic Surgery

## 2023-10-01 NOTE — Telephone Encounter (Signed)
Dr. Dallas Schimke pt - Sara [name] PT w/[facility] [phone] lvm on 12/27 at 2:19pm stating that Dr. Dallas Schimke cleared her for passive range of motion to left shoulder, wanted to make sure he is clear on that and any other clarifications remains, non weight bearing.  He stated he wanted to confirm this is correct.

## 2023-10-04 NOTE — Telephone Encounter (Signed)
Spoke with Romeo Apple and confirmed passive ROM for left shoulder

## 2023-10-18 ENCOUNTER — Other Ambulatory Visit (INDEPENDENT_AMBULATORY_CARE_PROVIDER_SITE_OTHER): Payer: Self-pay

## 2023-10-18 ENCOUNTER — Ambulatory Visit: Payer: PPO | Admitting: Orthopedic Surgery

## 2023-10-18 ENCOUNTER — Encounter: Payer: Self-pay | Admitting: Orthopedic Surgery

## 2023-10-18 DIAGNOSIS — S42292D Other displaced fracture of upper end of left humerus, subsequent encounter for fracture with routine healing: Secondary | ICD-10-CM | POA: Diagnosis not present

## 2023-10-18 DIAGNOSIS — S42342D Displaced spiral fracture of shaft of humerus, left arm, subsequent encounter for fracture with routine healing: Secondary | ICD-10-CM

## 2023-10-18 NOTE — Progress Notes (Signed)
 Orthopaedic Clinic Return  Assessment: Sara [name] is a 83 y.o. female with the following: Valgus impacted left proximal humerus fracture Left proximal humerus shaft fracture   Plan: Sara [name] continues to improve.  She is deformity of the upper arm, as well as the left shoulder.  However, her function and pain are improving.  Dissipate continued improvements in pain and function.  She will note progressive improvements in her swelling.  Provided reassurance.  Radiographs remained stable.  There is callus formation.  No gross motion at fracture site.  Continue to work with therapy, as long as it is beneficial.  Follow-up in 6 weeks.  Follow-up: Return in about 6 weeks (around 11/29/2023).   Subjective:  Chief Complaint  Patient presents with   Fracture    L shoulder DOI 08/08/23    History of Present Illness: Sara [name] is a 83 y.o. female who returns to clinic for evaluation of left shoulder pain.  She fell and sustained a left proximal humerus fracture, as well as a humeral shaft fracture, approximately 2 months ago.  Since last visit, she continued to wear the Sarmiento brace for an additional 2 weeks.  Over the past couple of weeks, she has remove the sling, and is no longer using a brace.  She reports progressive improvement in her pain and function.  She still has some swelling.  She has limited active motion, but notes that her passive motion is getting better.  On a regular basis, she notes that she is able to do more around the house.   Review of Systems: No fevers or chills No numbness or tingling No chest pain No shortness of breath No bowel or bladder dysfunction No GI distress No headaches   Objective: There were no vitals taken for this visit.  Physical Exam:  Alert and oriented.  No acute distress.  She is using a cane in the right hand to assist with ambulation.  Left shoulder without bruising.  Mild swelling of the upper arm.  Sensation intact in  the axillary nerve distribution.  Fingers are warm and well-perfused.  She tolerates passive forward flexion to 90 degrees.  Limited active flexion.  IMAGING: I personally ordered and reviewed the following images:   Trays of the left shoulder were obtained in clinic today.  These are compared to prior x-rays.  Valgus impacted fracture of the proximal humerus, separate to the tuberosity fragments remains in stable alignment.  No change in position.  Glenohumeral joint is reduced.  Midshaft humerus fracture remains mildly displaced.  Approximate 25 degrees angulation, as noted on the lateral.  This is stable.  There is callus formation about the humeral shaft fracture.  No dislocation.  Impression: Stable left proximal humerus and humerus shaft fractures  Oneil DELENA Horde, MD 10/18/2023 2:42 PM

## 2023-10-20 DIAGNOSIS — Z13220 Encounter for screening for lipoid disorders: Secondary | ICD-10-CM | POA: Diagnosis not present

## 2023-10-20 DIAGNOSIS — R739 Hyperglycemia, unspecified: Secondary | ICD-10-CM | POA: Diagnosis not present

## 2023-10-20 DIAGNOSIS — Z1329 Encounter for screening for other suspected endocrine disorder: Secondary | ICD-10-CM | POA: Diagnosis not present

## 2023-10-20 DIAGNOSIS — R5383 Other fatigue: Secondary | ICD-10-CM | POA: Diagnosis not present

## 2023-10-21 ENCOUNTER — Telehealth: Payer: Self-pay | Admitting: Orthopedic Surgery

## 2023-10-21 DIAGNOSIS — E7849 Other hyperlipidemia: Secondary | ICD-10-CM | POA: Diagnosis not present

## 2023-10-21 DIAGNOSIS — E876 Hypokalemia: Secondary | ICD-10-CM | POA: Diagnosis not present

## 2023-10-21 DIAGNOSIS — Z1329 Encounter for screening for other suspected endocrine disorder: Secondary | ICD-10-CM | POA: Diagnosis not present

## 2023-10-21 DIAGNOSIS — R739 Hyperglycemia, unspecified: Secondary | ICD-10-CM | POA: Diagnosis not present

## 2023-10-21 DIAGNOSIS — K219 Gastro-esophageal reflux disease without esophagitis: Secondary | ICD-10-CM | POA: Diagnosis not present

## 2023-10-21 DIAGNOSIS — E782 Mixed hyperlipidemia: Secondary | ICD-10-CM | POA: Diagnosis not present

## 2023-10-21 NOTE — Telephone Encounter (Signed)
Dr. Dallas Schimke pt - Sara [name] PT w/[facility] [phone] lvm stating that he would like to get clarifications on weight bearing, use of upper lt extremity.  Can they begin active assisted or active range of motion per tolerance, gentle of course.

## 2023-10-21 NOTE — Telephone Encounter (Signed)
Verbal order given to [place]. No further questions or concerns at this time.

## 2023-10-25 DIAGNOSIS — F419 Anxiety disorder, unspecified: Secondary | ICD-10-CM | POA: Diagnosis not present

## 2023-10-25 DIAGNOSIS — I951 Orthostatic hypotension: Secondary | ICD-10-CM | POA: Diagnosis not present

## 2023-10-25 DIAGNOSIS — K219 Gastro-esophageal reflux disease without esophagitis: Secondary | ICD-10-CM | POA: Diagnosis not present

## 2023-10-25 DIAGNOSIS — Z6832 Body mass index (BMI) 32.0-32.9, adult: Secondary | ICD-10-CM | POA: Diagnosis not present

## 2023-10-25 DIAGNOSIS — Z8781 Personal history of (healed) traumatic fracture: Secondary | ICD-10-CM | POA: Diagnosis not present

## 2023-11-10 ENCOUNTER — Encounter: Payer: Self-pay | Admitting: Internal Medicine

## 2023-11-10 ENCOUNTER — Ambulatory Visit: Payer: PPO | Attending: Internal Medicine | Admitting: Internal Medicine

## 2023-11-10 VITALS — BP 162/84 | HR 81 | Ht 63.0 in | Wt 177.0 lb

## 2023-11-10 DIAGNOSIS — M7989 Other specified soft tissue disorders: Secondary | ICD-10-CM | POA: Diagnosis not present

## 2023-11-10 DIAGNOSIS — Z8679 Personal history of other diseases of the circulatory system: Secondary | ICD-10-CM | POA: Diagnosis not present

## 2023-11-10 NOTE — Progress Notes (Signed)
 Cardiology Office Note  Date: 11/10/2023   ID: Lizvette, Lightsey [birth year], MRN [id number]  PCP:  Jeanette Comer BRAVO, PA-C  Cardiologist:  Diannah SHAUNNA Maywood, MD Electrophysiologist:  None   History of Present Illness: Sara [name] is a 83 y.o. female known to have HTN was referred to cardiology for evaluation of orthostatic hypotension.  Patient was on HCTZ until November 2024 when she was hospitalized with a fall.  She broke her left shoulder but due to high risk surgery, conservative management was adopted.  Orthostatics were positive during November 2024 hospitalization resulting in discontinuation of HCTZ.  She was also symptomatic with dizziness at that time.  She was discharged and went to rehab with no symptoms.  Since November 2024, she had 4 episodes of dizziness so far.  Otherwise she says she feels little dizzy when she gets up from bed first thing in the morning, especially when she sits up from a lying position.  Otherwise, she does not have any other symptoms of chest pain, syncope or presyncope.  She does have shortness of breath sometimes with exertion.  She also has bilateral lower EXTR swelling.  Past Medical History:  Diagnosis Date   Anemia    IDA   GERD (gastroesophageal reflux disease)    Hernia of unspecified site of abdominal cavity without mention of obstruction or gangrene    Hypertension    Macular degeneration     Past Surgical History:  Procedure Laterality Date   ABDOMINAL HYSTERECTOMY     CHOLECYSTECTOMY     COLONOSCOPY     ESOPHAGOGASTRODUODENOSCOPY  12/21/2011   Procedure: ESOPHAGOGASTRODUODENOSCOPY (EGD);  Surgeon: Claudis RAYMOND Rivet, MD;  Location: [facility];  Service: Endoscopy;  Laterality: N/A;  730   ORIF ANKLE FRACTURE     right ankle   UPPER GASTROINTESTINAL ENDOSCOPY      Current Outpatient Medications  Medication Sig Dispense Refill   HYDROcodone-acetaminophen  (NORCO/VICODIN) 5-325 MG tablet Take 1 tablet by mouth daily  as needed.     acetaminophen  (TYLENOL ) 650 MG CR tablet Take 650 mg by mouth every 8 (eight) hours.     ALPRAZolam  (XANAX ) 0.25 MG tablet Take 0.25 mg by mouth daily.     ezetimibe  (ZETIA ) 10 MG tablet Take 1 tablet (10 mg total) by mouth daily. 30 tablet 0   gabapentin  (NEURONTIN ) 100 MG capsule Take 100 mg by mouth daily.     pantoprazole  (PROTONIX ) 40 MG tablet Take 40 mg by mouth 2 (two) times daily.     Vitamins-Lipotropics (LIPOFLAVONOID PO) Take 1 tablet by mouth 2 (two) times daily.     No current facility-administered medications for this visit.   Allergies:  Hydrochlorothiazide   Social History: The patient  reports that she has never smoked. She has never used smokeless tobacco. She reports that she does not currently use alcohol . She reports that she does not use drugs.   Family History: The patient's family history includes Aneurysm in her father; Healthy in her daughter, daughter, daughter, son, and son; Heart disease in her mother; Pancreatic cancer in her brother.   ROS:  Please see the history of present illness. Otherwise, complete review of systems is positive for none  All other systems are reviewed and negative.   Physical Exam: VS:  BP (!) 162/84   Pulse 81   Ht 5' 3 (1.6 m)   Wt 177 lb (80.3 kg)   SpO2 97%   BMI 31.35 kg/m , BMI Body mass  index is 31.35 kg/m.  Wt Readings from Last 3 Encounters:  11/10/23 177 lb (80.3 kg)  08/23/23 234 lb (106.1 kg)  08/15/23 177 lb 9.6 oz (80.6 kg)    General: Patient appears comfortable at rest. HEENT: Conjunctiva and lids normal, oropharynx clear with moist mucosa. Neck: Supple, no elevated JVP or carotid bruits, no thyromegaly. Lungs: Clear to auscultation, nonlabored breathing at rest. Cardiac: Regular rate and rhythm, no S3 or significant systolic murmur, no pericardial rub. Abdomen: Soft, nontender, no hepatomegaly, bowel sounds present, no guarding or rebound. Extremities: 1+ pitting edema, distal pulses  2+. Skin: Warm and dry. Musculoskeletal: No kyphosis. Neuropsychiatric: Alert and oriented x3, affect grossly appropriate.  Recent Labwork: 08/10/2023: ALT 16; AST 15; Magnesium 1.8 08/11/2023: Hemoglobin 11.1; Platelets 206 08/15/2023: BUN 14; Creatinine, Ser 0.85; Potassium 4.2; Sodium 138  No results found for: CHOL, TRIG, HDL, CHOLHDL, VLDL, LDLCALC, LDLDIRECT   Assessment and Plan:  History of orthostatic hypotension in November 2024 HTN, poorly controlled DOE and lower extremity swelling, rule out CHF  Orthostatic vitals today in the clinic: Lying down: 169/91, 84 bpm Standing at 0-minute: 181/108, 108 bpm Standing at 3 minutes: 177/102, 102 bpm   -Patient had history of orthostatic hypotension from HCTZ resulting in a fall and hence had to be discontinued during November 2024 hospitalization.  She also had severe hypokalemia from HCTZ.  Orthostatic vitals today in the clinic was negative for orthostatic hypotension but she felt dizzy transitioning from lying to sitting position (ongoing for a long time).  She describes the symptoms similar to vertigo, instructed her to take meclizine  round-the-clock and not as needed.  She verbalized understanding.  Her blood pressures are elevated today in the clinic, 160 to 180 mmHg SBP.  Does not check her BPs at home but usually BPs are checked by her physical therapist, sometimes they are normal and sometimes they are not.  Encouraged her to make an appointment with her PCP for the management of HTN.  Goal BP less than 150/90 mmHg. -She does report DOE sometimes and has bilateral lower EXTR swelling.  Swelling likely secondary to chronic venous insufficiency seen in the elderly but will need to rule out cardiomyopathy from poorly controlled HTN. Obtain echocardiogram.       Medication Adjustments/Labs and Tests Ordered: Current medicines are reviewed at length with the patient today.  Concerns regarding medicines are outlined  above.    Disposition:  Follow up  pending results  Signed Jaisen Wiltrout Priya Ulmer Degen, MD, 11/10/2023 3:29 PM    [facility] [address] [place], [place], [place] [address]

## 2023-11-10 NOTE — Patient Instructions (Addendum)
 Medication Instructions:  Your physician recommends that you continue on your current medications as directed. Please refer to the Current Medication list given to you today.   Labwork: None  Testing/Procedures: Your physician has requested that you have an echocardiogram. Echocardiography is a painless test that uses sound waves to create images of your heart. It provides your doctor with information about the size and shape of your heart and how well your heart's chambers and valves are working. This procedure takes approximately one hour. There are no restrictions for this procedure. Please do NOT wear cologne, perfume, aftershave, or lotions (deodorant is allowed). Please arrive 15 minutes prior to your appointment time.  Please note: We ask at that you not bring children with you during ultrasound (echo/ vascular) testing. Due to room size and safety concerns, children are not allowed in the ultrasound rooms during exams. Our front office staff cannot provide observation of children in our lobby area while testing is being conducted. An adult accompanying a patient to their appointment will only be allowed in the ultrasound room at the discretion of the ultrasound technician under special circumstances. We apologize for any inconvenience.   Follow-Up: Your physician recommends that you schedule a follow-up appointment in: Pending Results  Any Other Special Instructions Will Be Listed Below (If Applicable).  Thank you for choosing [facility]!     If you need a refill on your cardiac medications before your next appointment, please call your pharmacy.

## 2023-11-14 ENCOUNTER — Ambulatory Visit: Payer: PPO

## 2023-11-23 ENCOUNTER — Ambulatory Visit: Payer: PPO

## 2023-11-29 ENCOUNTER — Encounter: Payer: Self-pay | Admitting: Orthopedic Surgery

## 2023-11-29 ENCOUNTER — Other Ambulatory Visit (INDEPENDENT_AMBULATORY_CARE_PROVIDER_SITE_OTHER): Payer: Self-pay

## 2023-11-29 ENCOUNTER — Ambulatory Visit (INDEPENDENT_AMBULATORY_CARE_PROVIDER_SITE_OTHER): Payer: PPO | Admitting: Orthopedic Surgery

## 2023-11-29 DIAGNOSIS — S42342D Displaced spiral fracture of shaft of humerus, left arm, subsequent encounter for fracture with routine healing: Secondary | ICD-10-CM | POA: Diagnosis not present

## 2023-11-29 DIAGNOSIS — S42292D Other displaced fracture of upper end of left humerus, subsequent encounter for fracture with routine healing: Secondary | ICD-10-CM

## 2023-11-29 NOTE — Progress Notes (Signed)
 Orthopaedic Clinic Return  Assessment: Sara [name] is a 83 y.o. female with the following: Valgus impacted left proximal humerus fracture Left proximal humerus shaft fracture   Plan: Sara [name] is doing much better overall.  She notes gradual improvement.  Radiographs remain unchanged.  She may not attain full function, but anticipate gradual improvement.  There is no gross motion at the fracture site, particularly within the humeral shaft.  Medicines as needed.  Continue to work on range of motion and strength.  Cane or walker to assist with ambulation.  If she has any issues, at any time, I am happy to see her in clinic.  Otherwise, follow-up as needed.  Follow-up: Return if symptoms worsen or fail to improve.   Subjective:  Chief Complaint  Patient presents with   Fracture    L shoulder DOI 08/08/23    History of Present Illness: Sara [name] is a 83 y.o. female who returns to clinic for evaluation of left shoulder pain.  She fell and sustained a left proximal humerus fracture, as well as a humeral shaft fracture, almost 4 months ago.  She notes gradual improvement in pain and function.  She remains scared of falling, and as a result is shuffling, moving slowly.  She is taking Tylenol and ibuprofen for pain.  She does not notice any gross motion through the fractures.  Review of Systems: No fevers or chills No numbness or tingling No chest pain No shortness of breath No bowel or bladder dysfunction No GI distress No headaches   Objective: There were no vitals taken for this visit.  Physical Exam:  Alert and oriented.  No acute distress.  She is using a cane in the right hand to assist with ambulation.  Left shoulder without bruising.  No swelling.  No obvious deformity.  There is no gross motion through the humeral shaft fracture site.  There is tenderness to palpation.  She tolerates forward flexion to approximately 90 degrees.  She is able to get her hand to  her mouth.  Not quite able to get her hand to her head.  Sensation is intact in the axillary nerve distribution.  Sensation intact throughout the left hand.  Fingers are warm and well-perfused.  IMAGING: I personally ordered and reviewed the following images:   X-rays of the left shoulder were obtained in clinic today.  These are compared available x-rays.  Valgus impacted fracture of the proximal humerus remains in stable alignment.  The glenohumeral joint is reduced.  Oblique fracture through the shaft remains in unchanged alignment.  There is some valgus angulation as noted on the lateral of the shoulder.  Callus formation about the fracture of the shaft.  No interval displacement.  Overall alignment remains unchanged.  Impression: Stable left proximal humerus and humeral shaft fractures.  Oliver Barre, MD 11/29/2023 2:40 PM

## 2023-12-05 ENCOUNTER — Ambulatory Visit: Payer: PPO | Attending: Internal Medicine

## 2023-12-05 DIAGNOSIS — R600 Localized edema: Secondary | ICD-10-CM

## 2023-12-05 DIAGNOSIS — M7989 Other specified soft tissue disorders: Secondary | ICD-10-CM

## 2023-12-05 LAB — ECHOCARDIOGRAM COMPLETE
AR max vel: 1.71 cm2
AV Area VTI: 1.79 cm2
AV Area mean vel: 1.81 cm2
AV Mean grad: 9 mmHg
AV Peak grad: 18.5 mmHg
Ao pk vel: 2.15 m/s
Area-P 1/2: 4.04 cm2
Calc EF: 58.3 %
MV VTI: 2.3 cm2
S' Lateral: 2.6 cm
Single Plane A2C EF: 53.7 %
Single Plane A4C EF: 61.4 %

## 2023-12-07 ENCOUNTER — Other Ambulatory Visit: Payer: PPO

## 2023-12-08 DIAGNOSIS — Z6832 Body mass index (BMI) 32.0-32.9, adult: Secondary | ICD-10-CM | POA: Diagnosis not present

## 2023-12-08 DIAGNOSIS — K219 Gastro-esophageal reflux disease without esophagitis: Secondary | ICD-10-CM | POA: Diagnosis not present

## 2023-12-08 DIAGNOSIS — Z8781 Personal history of (healed) traumatic fracture: Secondary | ICD-10-CM | POA: Diagnosis not present

## 2023-12-08 DIAGNOSIS — F419 Anxiety disorder, unspecified: Secondary | ICD-10-CM | POA: Diagnosis not present

## 2023-12-08 DIAGNOSIS — B0229 Other postherpetic nervous system involvement: Secondary | ICD-10-CM | POA: Diagnosis not present

## 2024-01-13 ENCOUNTER — Other Ambulatory Visit: Payer: Self-pay | Admitting: Internal Medicine

## 2024-01-13 MED ORDER — FUROSEMIDE 20 MG PO TABS: 20.0000 mg | ORAL_TABLET | ORAL | 0 refills | Status: DC | PRN

## 2024-01-24 DIAGNOSIS — E876 Hypokalemia: Secondary | ICD-10-CM | POA: Diagnosis not present

## 2024-01-24 DIAGNOSIS — F419 Anxiety disorder, unspecified: Secondary | ICD-10-CM | POA: Diagnosis not present

## 2024-01-24 DIAGNOSIS — K219 Gastro-esophageal reflux disease without esophagitis: Secondary | ICD-10-CM | POA: Diagnosis not present

## 2024-01-24 DIAGNOSIS — Z6830 Body mass index (BMI) 30.0-30.9, adult: Secondary | ICD-10-CM | POA: Diagnosis not present

## 2024-01-24 DIAGNOSIS — B0229 Other postherpetic nervous system involvement: Secondary | ICD-10-CM | POA: Diagnosis not present

## 2024-01-24 DIAGNOSIS — D649 Anemia, unspecified: Secondary | ICD-10-CM | POA: Diagnosis not present

## 2024-01-24 DIAGNOSIS — E039 Hypothyroidism, unspecified: Secondary | ICD-10-CM | POA: Diagnosis not present

## 2024-01-24 DIAGNOSIS — Z8781 Personal history of (healed) traumatic fracture: Secondary | ICD-10-CM | POA: Diagnosis not present

## 2024-02-17 ENCOUNTER — Other Ambulatory Visit: Payer: Self-pay | Admitting: Internal Medicine

## 2024-02-17 DIAGNOSIS — E876 Hypokalemia: Secondary | ICD-10-CM

## 2024-02-17 NOTE — Telephone Encounter (Signed)
*  STAT* If patient is at the pharmacy, call can be transferred to refill team.   1. Which medications need to be refilled? (please list name of each medication and dose if known) Furosemide  and Potassium    2. Would you like to learn more about the convenience, safety, & potential cost savings by using the [facility]?     3. Are you open to using the [facility] (Type [facility].  4. Which pharmacy/location (including street and city if local pharmacy) is medication to be sent to?[facility]  [address] [place]   5. Do they need a 30 day or 90 day supply? 90 days and refills  please call in today- out of medicine patient said to ask them to deliver it

## 2024-02-17 NOTE — Telephone Encounter (Signed)
 Pt is requesting a refill on medication furosemide  and potassium. Pt was supposed to take medications for 20 days and call back to let provider know. Potassium is no longer on pt's medication list. Would Dr. Mallipeddi like to refill these medications? Please address

## 2024-02-22 ENCOUNTER — Other Ambulatory Visit: Payer: Self-pay | Admitting: Internal Medicine

## 2024-02-22 MED ORDER — FUROSEMIDE 20 MG PO TABS: 20.0000 mg | ORAL_TABLET | ORAL | 0 refills | Status: AC | PRN

## 2024-02-22 MED ORDER — POTASSIUM CHLORIDE CRYS ER 20 MEQ PO TBCR: 20.0000 meq | EXTENDED_RELEASE_TABLET | ORAL | 3 refills | Status: AC | PRN

## 2024-02-22 NOTE — Telephone Encounter (Signed)
 Spoke will Doctor Mallipeddi she states to fill lasix  at 20 Mg daily as needed for DOE  And KCL 20 MeQ PRN only when taking lasix 

## 2024-02-22 NOTE — Progress Notes (Signed)
 Spoke will Doctor Mallipeddi she states to fill lasix  at 20 Mg daily as needed for DOE  And KCL 20 MeQ PRN only when taking lasix 

## 2024-04-30 DIAGNOSIS — Z1329 Encounter for screening for other suspected endocrine disorder: Secondary | ICD-10-CM | POA: Diagnosis not present

## 2024-04-30 DIAGNOSIS — R5383 Other fatigue: Secondary | ICD-10-CM | POA: Diagnosis not present

## 2024-04-30 DIAGNOSIS — Z6830 Body mass index (BMI) 30.0-30.9, adult: Secondary | ICD-10-CM | POA: Diagnosis not present

## 2024-04-30 DIAGNOSIS — K219 Gastro-esophageal reflux disease without esophagitis: Secondary | ICD-10-CM | POA: Diagnosis not present

## 2024-04-30 DIAGNOSIS — E7849 Other hyperlipidemia: Secondary | ICD-10-CM | POA: Diagnosis not present

## 2024-04-30 DIAGNOSIS — Z8781 Personal history of (healed) traumatic fracture: Secondary | ICD-10-CM | POA: Diagnosis not present

## 2024-04-30 DIAGNOSIS — F419 Anxiety disorder, unspecified: Secondary | ICD-10-CM | POA: Diagnosis not present

## 2024-04-30 DIAGNOSIS — E876 Hypokalemia: Secondary | ICD-10-CM | POA: Diagnosis not present

## 2024-04-30 DIAGNOSIS — B0229 Other postherpetic nervous system involvement: Secondary | ICD-10-CM | POA: Diagnosis not present

## 2024-07-18 ENCOUNTER — Encounter (INDEPENDENT_AMBULATORY_CARE_PROVIDER_SITE_OTHER): Payer: Self-pay | Admitting: Gastroenterology

## 2024-09-06 DIAGNOSIS — Z6831 Body mass index (BMI) 31.0-31.9, adult: Secondary | ICD-10-CM | POA: Diagnosis not present

## 2024-09-06 DIAGNOSIS — G629 Polyneuropathy, unspecified: Secondary | ICD-10-CM | POA: Diagnosis not present

## 2024-09-06 DIAGNOSIS — F419 Anxiety disorder, unspecified: Secondary | ICD-10-CM | POA: Diagnosis not present

## 2024-09-06 DIAGNOSIS — I10 Essential (primary) hypertension: Secondary | ICD-10-CM | POA: Diagnosis not present

## 2024-09-06 DIAGNOSIS — K219 Gastro-esophageal reflux disease without esophagitis: Secondary | ICD-10-CM | POA: Diagnosis not present

## 2024-09-06 DIAGNOSIS — B0229 Other postherpetic nervous system involvement: Secondary | ICD-10-CM | POA: Diagnosis not present

## 2024-09-06 DIAGNOSIS — Z8781 Personal history of (healed) traumatic fracture: Secondary | ICD-10-CM | POA: Diagnosis not present
